# Patient Record
Sex: Female | Born: 1955 | Race: White | Marital: Married | Smoking: Never smoker
Health system: Northeastern US, Academic
[De-identification: ages and names within clinical notes are randomized; demographics above are authoritative.]

## PROBLEM LIST (undated history)

## (undated) DIAGNOSIS — E079 Disorder of thyroid, unspecified: Secondary | ICD-10-CM

## (undated) DIAGNOSIS — K219 Gastro-esophageal reflux disease without esophagitis: Secondary | ICD-10-CM

## (undated) DIAGNOSIS — I10 Essential (primary) hypertension: Secondary | ICD-10-CM

## (undated) DIAGNOSIS — J45909 Unspecified asthma, uncomplicated: Secondary | ICD-10-CM

## (undated) DIAGNOSIS — M1990 Unspecified osteoarthritis, unspecified site: Secondary | ICD-10-CM

## (undated) HISTORY — DX: Unspecified osteoarthritis, unspecified site: M19.90

## (undated) HISTORY — PX: DILATION AND CURETTAGE OF UTERUS: SHX78

## (undated) HISTORY — PX: COLONOSCOPY: SHX174

## (undated) HISTORY — DX: Unspecified asthma, uncomplicated: J45.909

## (undated) HISTORY — DX: Gastro-esophageal reflux disease without esophagitis: K21.9

## (undated) HISTORY — PX: TUBAL LIGATION: SHX77

## (undated) HISTORY — PX: APPENDECTOMY: SHX54

## (undated) HISTORY — DX: Essential (primary) hypertension: I10

## (undated) HISTORY — PX: CHOLECYSTECTOMY: SHX55

## (undated) HISTORY — DX: Disorder of thyroid, unspecified: E07.9

---

## 2013-01-13 ENCOUNTER — Encounter: Payer: Self-pay | Admitting: Gastroenterology

## 2013-01-13 LAB — HM COLONOSCOPY

## 2013-05-25 LAB — LDL CHOLESTEROL, DIRECT: LDL Calculated: 93

## 2014-01-13 LAB — HM MAMMOGRAPHY

## 2014-09-29 ENCOUNTER — Encounter: Payer: Self-pay | Admitting: Family Medicine

## 2014-12-04 ENCOUNTER — Other Ambulatory Visit: Payer: Self-pay | Admitting: Pediatrics

## 2014-12-04 ENCOUNTER — Ambulatory Visit: Payer: Self-pay | Admitting: Pediatrics

## 2014-12-04 VITALS — BP 134/76 | HR 80 | Temp 97.6°F | Ht 62.75 in | Wt 194.0 lb

## 2014-12-04 DIAGNOSIS — I10 Essential (primary) hypertension: Secondary | ICD-10-CM

## 2014-12-04 DIAGNOSIS — J309 Allergic rhinitis, unspecified: Secondary | ICD-10-CM

## 2014-12-04 DIAGNOSIS — E039 Hypothyroidism, unspecified: Secondary | ICD-10-CM

## 2014-12-04 DIAGNOSIS — J45909 Unspecified asthma, uncomplicated: Secondary | ICD-10-CM

## 2014-12-04 MED ORDER — TRIAMCINOLONE ACETONIDE 0.025 % EX LOTN *A*
TOPICAL_LOTION | Freq: Two times a day (BID) | CUTANEOUS | 1 refills | Status: DC | PRN
Start: 2014-12-04 — End: 2016-02-01

## 2014-12-04 MED ORDER — FLUTICASONE PROPIONATE 50 MCG/ACT NA SUSP *I*
2.0000 | Freq: Every day | NASAL | 1 refills | Status: DC
Start: 2014-12-04 — End: 2015-06-14

## 2014-12-04 MED ORDER — ALBUTEROL SULFATE HFA 108 (90 BASE) MCG/ACT IN AERS *I*
2.0000 | INHALATION_SPRAY | RESPIRATORY_TRACT | 1 refills | Status: DC | PRN
Start: 2014-12-04 — End: 2017-06-01

## 2014-12-04 NOTE — Progress Notes (Signed)
There is no problem list on file for this patient.        Subjective:     Patient ID: Patty ForestBridget Paschal is a 59 y.o. female.    HPI  Here for recheck HTN and Hypothyroidism.  States feels great.  No complaints has not had labs drawn in awhile.  Lives in [place] during the winter and does see provider there.  States has not had labs drawn in quite awhile.  Eating and sleeping ok  No chest pain .  No Sob  Has hx of asthma, but no problems for a long time.      No current outpatient prescriptions on file prior to visit.     No current facility-administered medications on file prior to visit.      Allergy History as of 12/04/14     CLARITHROMYCIN       Noted Status Severity Type Reaction    09/29/14 1342 Tilden FossaBrown, Leslie 05/01/08 Active Low Allergy Other (See Comments)    Comments:  Reaction not specified             ERYTHROMYCIN       Noted Status Severity Type Reaction    09/29/14 1343 Tilden FossaBrown, Leslie 05/01/08 Active Low Allergy Other (See Comments)    Comments:  Reaction not specified             VALDECOXIB       Noted Status Severity Type Reaction    12/04/14 1134 Lacie ScottsSmith, Debra S, LPN 54/02/8105/06/16 Active Low Allergy Other (See Comments)    Comments:  unknown           BACITRACIN-POLYMYXIN B       Noted Status Severity Type Reaction    12/04/14 1135 Lacie ScottsSmith, Debra S, LPN 19/14/7804/12/13 Active Low Allergy Other (See Comments)    Comments:  unknown               No past medical history on file.    {Common ambulatory SmartLinks:19316::"Patient's medications, allergies, past medical, surgical, social and family histories were reviewed and updated as appropriate."    Review of Systems   Constitutional: Negative.    HENT: Negative.    Respiratory: Negative.    Cardiovascular: Negative.    Genitourinary: Negative.    Neurological: Negative.    Psychiatric/Behavioral: Negative.              Objective:   Physical Exam   Constitutional: She appears well-developed and well-nourished. No distress.   HENT:   Right Ear: Tympanic membrane normal.    Left Ear: Tympanic membrane normal.   Nose: Nose normal.   Mouth/Throat: Oropharynx is clear and moist.   Eyes: Pupils are equal, round, and reactive to light.   Neck: Neck supple. No thyromegaly present.   Cardiovascular: Normal rate, regular rhythm and normal heart sounds.    Pulmonary/Chest: Effort normal and breath sounds normal.   Lymphadenopathy:     She has no cervical adenopathy.   Neurological: She has normal reflexes.   Psychiatric: She has a normal mood and affect. Her speech is normal and behavior is normal. Thought content normal.             Assessment:      HTN  Hypothyroidsim   Hx of Asthma , stable     Plan:      Cont meds  Will check labs  Diet and exercise reviewed  rtc 6 months or prn

## 2014-12-05 ENCOUNTER — Encounter: Payer: Self-pay | Admitting: Gastroenterology

## 2014-12-05 ENCOUNTER — Ambulatory Visit: Payer: Self-pay | Admitting: Pediatrics

## 2014-12-06 ENCOUNTER — Telehealth: Payer: Self-pay | Admitting: Family Medicine

## 2014-12-06 NOTE — Telephone Encounter (Signed)
Pt notified as per Sterling BigSharon Rashaunda Rahl, FNP 12/04/14 labs all ok.

## 2014-12-25 ENCOUNTER — Telehealth: Payer: Self-pay

## 2014-12-25 NOTE — Telephone Encounter (Signed)
Left message for pt to call the office for an fu appointment

## 2014-12-25 NOTE — Telephone Encounter (Signed)
Pt made appt 02/05/15

## 2014-12-29 ENCOUNTER — Other Ambulatory Visit: Payer: Self-pay | Admitting: Family Medicine

## 2014-12-29 DIAGNOSIS — E039 Hypothyroidism, unspecified: Secondary | ICD-10-CM

## 2014-12-31 ENCOUNTER — Other Ambulatory Visit: Payer: Self-pay | Admitting: Family Medicine

## 2015-01-05 ENCOUNTER — Other Ambulatory Visit: Payer: Self-pay | Admitting: Family Medicine

## 2015-01-16 ENCOUNTER — Other Ambulatory Visit: Payer: Self-pay | Admitting: Family Medicine

## 2015-01-16 DIAGNOSIS — I10 Essential (primary) hypertension: Secondary | ICD-10-CM

## 2015-01-19 ENCOUNTER — Encounter: Payer: Self-pay | Admitting: Gastroenterology

## 2015-01-19 LAB — HM MAMMOGRAPHY

## 2015-02-05 ENCOUNTER — Ambulatory Visit: Payer: Self-pay | Admitting: Family Medicine

## 2015-04-03 ENCOUNTER — Other Ambulatory Visit: Payer: Self-pay | Admitting: Family Medicine

## 2015-05-31 ENCOUNTER — Encounter: Payer: Self-pay | Admitting: Family Medicine

## 2015-06-04 ENCOUNTER — Ambulatory Visit
Admission: RE | Admit: 2015-06-04 | Discharge: 2015-06-04 | Disposition: A | Discharge status: Home / Self Care | Payer: Self-pay | Source: Ambulatory Visit | Admitting: Family Medicine

## 2015-06-04 ENCOUNTER — Encounter: Payer: Self-pay | Admitting: Family Medicine

## 2015-06-04 ENCOUNTER — Ambulatory Visit: Payer: Self-pay | Admitting: Family Medicine

## 2015-06-04 VITALS — BP 130/74 | HR 80 | Temp 97.8°F | Ht 62.75 in | Wt 198.0 lb

## 2015-06-04 DIAGNOSIS — Z0000 Encounter for general adult medical examination without abnormal findings: Secondary | ICD-10-CM

## 2015-06-04 DIAGNOSIS — I10 Essential (primary) hypertension: Secondary | ICD-10-CM

## 2015-06-04 DIAGNOSIS — R5383 Other fatigue: Secondary | ICD-10-CM

## 2015-06-04 DIAGNOSIS — Z124 Encounter for screening for malignant neoplasm of cervix: Secondary | ICD-10-CM

## 2015-06-04 DIAGNOSIS — E039 Hypothyroidism, unspecified: Secondary | ICD-10-CM

## 2015-06-04 DIAGNOSIS — Z01419 Encounter for gynecological examination (general) (routine) without abnormal findings: Secondary | ICD-10-CM

## 2015-06-04 LAB — POCT URINALYSIS DIPSTICK
Bilirubin,Ur: NEGATIVE
Blood,UA POCT: NEGATIVE
Glucose,UA POCT: NORMAL
Ketones,UA POCT: NEGATIVE
Leuk Esterase,UA POCT: NEGATIVE
Lot #: 20526703
Nitrite,UA POCT: NEGATIVE
PH,UA POCT: 7 (ref 5–8)
Protein,UA POCT: NEGATIVE mg/dL
Specific gravity,UA POCT: 1.01 (ref 1.002–1.03)
Urobilinogen,UA: NORMAL

## 2015-06-04 NOTE — Progress Notes (Signed)
Subjective:      Patty Robertson is a 59yo white female here for a routine exam and Follow-up fro HTN, Hypothyroid.     Had Arthroscopy done on L knee last year in [place]. +Osteoarthritis in both knees.    Has been feeling good. Will be leaving for [facility] in a few weeks.     Gynecologic History  Menopausal.  Last Pap: 12/20/2012.Marland Kitchen Results were: normal  Last mammogram: 12/2013. Results were: normal      Patient's medications, allergies, past medical, surgical, social and family histories were reviewed and updated.    Current Outpatient Prescriptions   Medication    potassium chloride SA (KLOR-CON M20) 20 mEq  tablet    CARTIA XT 240 MG 24 hr capsule    amLODIPine (NORVASC) 2.5 MG tablet    triamterene-hydrochlorothiazide (DYAZIDE) 37.5-25 MG per capsule    SYNTHROID 112 MCG tablet    Calcium Polycarbophil (KONSYL FIBER PO)    calcium citrate-vitamin D (CITRACAL+D) 315-250 MG-UNIT per tablet    Glucosamine-Chondroit-Vit C-Mn (GLUCOSAMINE CHONDR 1500 COMPLX PO)    albuterol HFA 108 (90 BASE) MCG/ACT inhaler    fluticasone (FLONASE ALLERGY RELIEF) 50 MCG/ACT nasal spray    triamcinolone (KENALOG) 0.025 % lotion     No current facility-administered medications for this visit.          Review of Systems    Constitutional: She has been feeling good overall.  EENT: No visual disturbance, no recent URI symptoms or sore throat.  Neck: No neck pain or masses.  Pulmonary: No cough or shortness of breath. Rare wheezing, uses Albuterol couple times/wk.  Cardiac: No chest pain or palpitations  Gastrointestinal: No nausea, vomiting, constipation or diarrhea.  Mild heartburn with certain foods.  Genitourinary: No dysuria or frequency. No vaginal discharge. Minimal stress incontinence.  Musculoskeletal: Knees hurt with walking downhill.   No weakness.  Integument: No rashes.  Neurologic: No seizures or numbness.      Objective:     Visit Vitals    BP 130/74 (BP Location: Left arm, Patient Position: Sitting, Cuff Size: large adult)     Pulse 80    Temp 36.6 C (97.8 F) (Temporal)    Ht 1.594 m (5' 2.75")    Wt 89.8 kg (198 lb)    BMI 35.35 kg/m2         General: Well-developed well-nourished white female in no acute distress.  HEENT: Normocephalic; conjunctivae pink pupils reactive fundi normal; Nose mild congestion with clear discharge.TMs are normal; pharynx no erythema.  Neck: supple, no adenopathy, thyroid is symmetrically enlarged without nodules.  Lungs: Clear with decreased breath sounds in both bases.  Heart: Regular rate with soft systolic murmur.  No S3.  Carotids 2+ without bruits.  No JVD.  Breasts: Soft without masses.  Abdomen: Soft, nontender, no hepatosplenomegaly or other masses.  Active bowel sounds.  Back: Normal curvature, nontender.  Straight leg raising negative.  Extremities: Full range of motion, osteoarthritic changes of both knees.  Trace peripheral edema.  Pedal pulses intact.  Feet look good.  Neurologic: Deep tendon reflexes are +2 and symmetrical throughout.  Cranial nerves II through XII are grossly intact.  Strength intact throughout.  Normal sensation in hands and feet.  Pelvic: External shows no lesions.  There is a white scant discharge.  Cervix is normal, nontender.  Uterus normal size, nontender.  Adnexa norma  Rectal: No masses, normal tone. Small external hemorrhoid.      Assessment:      Healthy  female exam.   Hypertension  Hypothyroidism  Osteoarthritis knees  Overweight  Asthma/mild persistent  Allergic rhinitis     Plan:     She is to continue all her present medications.  Advised her to increase the Symbicort to 2 puffs twice a day if needed.  Encouraged her to start increasing exercise to help with her knees.  Did discuss that she may eventually need knee replacements.    She is to get labs done prior to leaving for [place].  We'll call her with results.    ThinPrep was done.  She has had mammogram done in July.    Follow-up next spring when she returns from [place].

## 2015-06-05 ENCOUNTER — Telehealth: Payer: Self-pay | Admitting: Family Medicine

## 2015-06-05 ENCOUNTER — Other Ambulatory Visit: Payer: Self-pay | Admitting: Family Medicine

## 2015-06-05 LAB — COMPREHENSIVE METABOLIC PANEL
ALT: 26 U/L (ref 12–78)
AST: 21 U/L (ref 15–37)
Albumin: 3.8 g/dL (ref 3.4–5.0)
Alk Phos: 71 U/L (ref 46–116)
Anion Gap: 8.8 mmol/L (ref 6–15)
Bilirubin,Total: 0.4 mg/dL (ref 0.2–1.0)
CO2: 30.2 mmol/L (ref 21–32)
Calcium: 9.2 mg/dL (ref 8.5–10.1)
Chloride: 101 mmol/L (ref 98–107)
Creatinine: 0.91 mg/dL (ref 0.55–1.02)
GFR,Black: 76.56 *
GFR,Caucasian: 63.27 *
Glucose: 84 mg/dL (ref 74–106)
Lab: 8 mg/dL (ref 7–18)
Potassium: 3.6 mmol/L (ref 3.5–5.1)
Sodium: 140 mmol/L (ref 136–145)
Total Protein: 7.6 g/dL (ref 6.4–8.2)

## 2015-06-05 LAB — CBC AND DIFFERENTIAL
Baso # K/uL: 0.04 10*3/uL (ref 0.00–0.07)
Basophil %: 0.5 % (ref 0–1)
Eos # K/uL: 0.29 10*3/uL (ref 0.05–0.5)
Eosinophil %: 3.6 % (ref 0.7–10.0)
Hematocrit: 43.5 % (ref 36.0–44.5)
Hemoglobin: 14.7 g/dL (ref 12.5–15.3)
Lymph # K/uL: 3.34 10*3/uL (ref 1.1–4.0)
Lymphocyte %: 41.9 % — ABNORMAL HIGH (ref 24.0–40)
MCH: 31.2 pg (ref 28.6–32.6)
MCHC: 33.8 g/dl (ref 32.0–36.0)
MCV: 92.4 fl (ref 80–100)
Mean Platelet Volume: 9.3 fl (ref 8.0–12.0)
Mono # K/uL: 0.63 10*3/uL (ref 0.2–1.0)
Monocyte %: 7.9 % (ref 4.0–15.0)
Neut # K/uL: 3.68 10*3/uL (ref 2.0–7.1)
Platelets: 322 10*3/uL (ref 140–400)
RBC Distribution Width-SD: 39.8 fL (ref 25.0–75.0)
RBC: 4.71 10*6/uL (ref 3.9–5.2)
RDW: 12 % (ref 11.0–15.0)
Seg Neut %: 46.1 % — ABNORMAL LOW (ref 48.0–70.0)
WBC: 7.98 10*3/uL (ref 4.0–12.0)

## 2015-06-05 LAB — LIPID PANEL
Chol/HDL Ratio: 4
Cholesterol: 209 MG/dL — ABNORMAL HIGH (ref 109–200)
HDL: 51 mg/dL (ref 40–60)
LDL Calculated: 140.2 mg/dL
Triglycerides: 89 mg/dL (ref 30–199)

## 2015-06-05 LAB — TSH: TSH: 3.207 u[IU]/mL (ref 0.358–3.74)

## 2015-06-05 NOTE — Telephone Encounter (Signed)
Information given to patient as per Dr. Peterson.

## 2015-06-05 NOTE — Telephone Encounter (Signed)
Labs are all ok; chol is better

## 2015-06-12 ENCOUNTER — Encounter: Payer: Self-pay | Admitting: Family Medicine

## 2015-06-12 LAB — GYN CYTOLOGY

## 2015-06-14 ENCOUNTER — Other Ambulatory Visit: Payer: Self-pay | Admitting: Pediatrics

## 2015-06-14 DIAGNOSIS — J309 Allergic rhinitis, unspecified: Secondary | ICD-10-CM

## 2015-06-25 ENCOUNTER — Other Ambulatory Visit: Payer: Self-pay | Admitting: Family Medicine

## 2015-06-25 DIAGNOSIS — E039 Hypothyroidism, unspecified: Secondary | ICD-10-CM

## 2015-07-02 ENCOUNTER — Other Ambulatory Visit: Payer: Self-pay | Admitting: Pediatrics

## 2015-07-07 ENCOUNTER — Other Ambulatory Visit: Payer: Self-pay | Admitting: Family Medicine

## 2015-07-24 ENCOUNTER — Other Ambulatory Visit: Payer: Self-pay | Admitting: Pediatrics

## 2015-07-24 ENCOUNTER — Telehealth: Payer: Self-pay | Admitting: Family Medicine

## 2015-07-24 DIAGNOSIS — I10 Essential (primary) hypertension: Secondary | ICD-10-CM

## 2015-07-24 NOTE — Telephone Encounter (Signed)
[facility] called and is [facility] but having issue with them as far as the script being sent in a timely manner.  Is there any issues on our end with them?

## 2015-07-24 NOTE — Telephone Encounter (Signed)
Requested script sent as per patient.

## 2015-09-28 ENCOUNTER — Other Ambulatory Visit: Payer: Self-pay | Admitting: Family Medicine

## 2015-12-11 ENCOUNTER — Other Ambulatory Visit: Payer: Self-pay | Admitting: Family Medicine

## 2015-12-11 DIAGNOSIS — J309 Allergic rhinitis, unspecified: Secondary | ICD-10-CM

## 2015-12-23 ENCOUNTER — Other Ambulatory Visit: Payer: Self-pay | Admitting: Family Medicine

## 2015-12-23 DIAGNOSIS — E039 Hypothyroidism, unspecified: Secondary | ICD-10-CM

## 2015-12-24 ENCOUNTER — Ambulatory Visit: Payer: Self-pay | Admitting: Pediatrics

## 2015-12-31 ENCOUNTER — Other Ambulatory Visit: Payer: Self-pay | Admitting: Family Medicine

## 2015-12-31 NOTE — Telephone Encounter (Signed)
Patient called back and is scheduled for 02/01/16 with Jasmine DecemberSharon

## 2015-12-31 NOTE — Telephone Encounter (Signed)
Please set her up for an appointment. Then send back so the script can be sent. Thank you.

## 2015-12-31 NOTE — Telephone Encounter (Signed)
Called and left Pt a message to call office back

## 2016-01-03 ENCOUNTER — Other Ambulatory Visit: Payer: Self-pay | Admitting: Family Medicine

## 2016-01-03 DIAGNOSIS — E039 Hypothyroidism, unspecified: Secondary | ICD-10-CM

## 2016-01-03 MED ORDER — SYNTHROID 112 MCG PO TABS
112.0000 ug | ORAL_TABLET | Freq: Every day | ORAL | 1 refills | Status: DC
Start: 2016-01-03 — End: 2016-07-01

## 2016-01-03 NOTE — Telephone Encounter (Signed)
Script sent to Dr. Peterson for approval. Patient notified.

## 2016-01-03 NOTE — Telephone Encounter (Signed)
Clarisse GougeBridget states that she had called [facility] regarding a refill for Synthroid. She was told by [facility] that her doctors office voided the refill. She states that she is not in any danger of running out of the medication in the immediate future but wants to get it straightened out before she does get low on it. She states she is currently in [place] because her husband recently had a knee replacement. She will be coming in for an appt on 02/01/16 with Jasmine DecemberSharon. Please advise.

## 2016-01-14 ENCOUNTER — Other Ambulatory Visit: Payer: Self-pay | Admitting: Family Medicine

## 2016-01-21 ENCOUNTER — Other Ambulatory Visit: Payer: Self-pay | Admitting: Gastroenterology

## 2016-01-21 LAB — HM MAMMOGRAPHY

## 2016-01-29 ENCOUNTER — Encounter: Payer: Self-pay | Admitting: Family Medicine

## 2016-02-01 ENCOUNTER — Other Ambulatory Visit: Payer: Self-pay

## 2016-02-01 ENCOUNTER — Ambulatory Visit: Payer: Self-pay | Admitting: Pediatrics

## 2016-02-01 ENCOUNTER — Encounter: Payer: Self-pay | Admitting: Pediatrics

## 2016-02-01 VITALS — BP 124/82 | HR 58 | Temp 97.8°F | Wt 199.0 lb

## 2016-02-01 DIAGNOSIS — J45909 Unspecified asthma, uncomplicated: Secondary | ICD-10-CM

## 2016-02-01 DIAGNOSIS — E039 Hypothyroidism, unspecified: Secondary | ICD-10-CM

## 2016-02-01 DIAGNOSIS — I10 Essential (primary) hypertension: Secondary | ICD-10-CM

## 2016-02-01 LAB — PCMH DEPRESSION ASSESSMENT

## 2016-02-01 MED ORDER — DILTIAZEM HCL COATED BEADS 240 MG PO CP24 *I*
240.0000 mg | ORAL_CAPSULE | Freq: Every day | ORAL | 1 refills | Status: DC
Start: 2016-02-01 — End: 2016-07-17

## 2016-02-01 MED ORDER — TRIAMCINOLONE ACETONIDE 0.025 % EX LOTN *A*
TOPICAL_LOTION | Freq: Two times a day (BID) | CUTANEOUS | 1 refills | Status: DC | PRN
Start: 2016-02-01 — End: 2020-02-02

## 2016-02-01 NOTE — Progress Notes (Signed)
Subjective:     Patient ID: Patty Robertson is a 60 y.o. female.    HPI  Here for recheck HTN, Hypothyroid and Asthma  HTN-  No headache,.  No chest pain no sob.  Hypothyroid- no cold intolerance, no weight gain or loss.  Sleeping fine  Asthma- recent problems with ASthma  Splits her time between here and [place]. Just returned to [place] recently.  Does have provider now in [place]- has copies of her recent labs with him which were all WNL.  Also her last visit note with him.      Current Outpatient Prescriptions on File Prior to Visit   Medication Sig Dispense Refill    amLODIPine (NORVASC) 2.5 MG tablet TAKE 1 TABLET DAILY 90 tablet 1    SYNTHROID 112 MCG tablet Take 1 tablet (112 mcg total) by mouth daily 90 tablet 1    triamterene-hydrochlorothiazide (DYAZIDE) 37.5-25 MG per capsule TAKE 1 CAPSULE DAILY 90 capsule 0    fluticasone (FLONASE) 50 MCG/ACT nasal spray USE 2 SPRAYS NASALLY DAILY 48 g 1    potassium chloride SA (KLOR-CON M20) 20 mEq  tablet TAKE 1 TABLET TWICE A DAY 180 tablet 1    CARTIA XT 240 MG 24 hr capsule TAKE 1 CAPSULE DAILY 90 capsule 1    calcium citrate-vitamin D (CITRACAL+D) 315-250 MG-UNIT per tablet Take 2 tablets by mouth daily      albuterol HFA 108 (90 BASE) MCG/ACT inhaler Inhale 2 puffs into the lungs every 4 hours as needed for Wheezing   Shake well before each use. 3 Inhaler 1    triamcinolone (KENALOG) 0.025 % lotion Apply topically 2 times daily as needed for Rash 60 mL 1    Calcium Polycarbophil (KONSYL FIBER PO) Take 6 g by mouth daily      Glucosamine-Chondroit-Vit C-Mn (GLUCOSAMINE CHONDR 1500 COMPLX PO) Take 1 tablet by mouth daily       No current facility-administered medications on file prior to visit.      Allergy History as of 02/01/16     CLARITHROMYCIN       Noted Status Severity Type Reaction    09/29/14 1342 Tilden Fossa 05/01/08 Active Low Allergy Other (See Comments)    Comments:  Reaction not specified             ERYTHROMYCIN       Noted Status Severity  Type Reaction    09/29/14 1343 Tilden Fossa 05/01/08 Active Low Allergy Other (See Comments)    Comments:  Reaction not specified             VALDECOXIB       Noted Status Severity Type Reaction    12/04/14 1134 Lacie Scotts, LPN 28/41/32 Active Low Allergy Other (See Comments)    Comments:  unknown           BACITRACIN-POLYMYXIN B       Noted Status Severity Type Reaction    12/04/14 1135 Lacie Scotts, LPN 44/01/02 Active Low Allergy Other (See Comments)    Comments:  unknown               Past Medical History:   Diagnosis Date    Arthritis     Asthma     GERD (gastroesophageal reflux disease)     Hypertension     Thyroid disease        :"Patient's medications, allergies, past medical, surgical, social and family histories were reviewed and updated as appropriate."}  Review of Systems   Constitutional: Negative.    HENT: Negative.    Respiratory: Negative.    Cardiovascular: Negative.    Genitourinary: Negative.    Neurological: Negative.            Objective:   Physical Exam   Constitutional: She appears well-developed and well-nourished. No distress.   HENT:   Nose: Nose normal.   Mouth/Throat: Oropharynx is clear and moist.   Eyes: Pupils are equal, round, and reactive to light.   Cardiovascular: Normal rate, regular rhythm and normal heart sounds.    Pulmonary/Chest: Effort normal and breath sounds normal.   Musculoskeletal: She exhibits no edema.   Lymphadenopathy:     She has no cervical adenopathy.   Neurological: She is alert. She has normal reflexes.   Psychiatric: She has a normal mood and affect. Her behavior is normal. Judgment and thought content normal.           Assessment:      HTN  Hypothyroid  Asthma       Plan:      1- [facility] Hypertension Plan    Based on this patients clinical history and according to JNC guidelines target BP goal is: less than 130/80  Based on the patient's last BP of BP: 124/82 the patient is: at goal  The plan to reach goal   1.  Reviewed pt's understanding of  medications including any barriers to adherence.   2.  Recommended lifestyle modifications: weight reduction, exercise plan, DASH eating plan, dietary sodium reduction and medication compliance   3.  The patient understands their clinical goals and will undertake self-management recommendations including:weight reduction  exercise plan  DASH eating plan  dietary sodium reduction  medication compliance     4.  Medication Management: no changes made    5.  Referral to Care Management:: No   6.  Patient Ed/Self-management tools provided: Current self-management tools adequate      2- labs reviewed from [place], TSH normal  Cont current synthroid dose    3-  Inhalers as needed  Will recheck beginning of Dec for her PE with MD before returning to [place]

## 2016-02-01 NOTE — Addendum Note (Signed)
Addended by: Sterling Big on: 02/01/2016 03:20 PM     Modules accepted: Orders

## 2016-03-26 ENCOUNTER — Other Ambulatory Visit: Payer: Self-pay | Admitting: Family Medicine

## 2016-03-30 ENCOUNTER — Other Ambulatory Visit: Payer: Self-pay | Admitting: Family Medicine

## 2016-06-02 ENCOUNTER — Telehealth: Payer: Self-pay | Admitting: Family Medicine

## 2016-06-02 DIAGNOSIS — I10 Essential (primary) hypertension: Secondary | ICD-10-CM

## 2016-06-02 DIAGNOSIS — E039 Hypothyroidism, unspecified: Secondary | ICD-10-CM

## 2016-06-02 NOTE — Telephone Encounter (Signed)
Any labs prior to Thursday appointment?

## 2016-06-02 NOTE — Telephone Encounter (Signed)
Pt notified as per Sharon Avanna Sowder, FNP. Lab req faxed to [facility] per pt request.

## 2016-06-02 NOTE — Telephone Encounter (Signed)
Patty Robertson is calling in regards to she has an appointment on 06/05/16 and she would like to know if she needs labs done please give her a call either way.

## 2016-06-02 NOTE — Telephone Encounter (Signed)
[facility]  Lipids   TSH

## 2016-06-03 ENCOUNTER — Other Ambulatory Visit: Payer: Self-pay | Admitting: Family Medicine

## 2016-06-03 LAB — COMPREHENSIVE METABOLIC PANEL
ALT: 25 U/L (ref 12–78)
AST: 17 U/L (ref 15–37)
Albumin: 3.6 g/dL (ref 3.4–5.0)
Alk Phos: 60 U/L (ref 46–116)
Anion Gap: 6.8 mmol/L (ref 6–15)
Bilirubin,Total: 0.3 mg/dL (ref 0.2–1.0)
CO2: 31.2 mmol/L (ref 21–32)
Calcium: 9.3 mg/dL (ref 8.5–10.1)
Chloride: 104 mmol/L (ref 98–107)
Creatinine: 0.89 mg/dL (ref 0.55–1.02)
GFR,Black: 78.28 (ref >60)
GFR,Caucasian: 64.7 (ref >60)
Glucose: 108 mg/dL — ABNORMAL HIGH (ref 74–106)
Lab: 13 mg/dL (ref 7–18)
Potassium: 3.9 mmol/L (ref 3.5–5.1)
Sodium: 142 mmol/L (ref 136–145)
Total Protein: 7.3 g/dL (ref 6.4–8.2)

## 2016-06-03 LAB — LIPID PANEL
Chol/HDL Ratio: 3.7
Cholesterol: 193 MG/dL (ref 109–200)
HDL: 51 mg/dL (ref 40–60)
LDL Calculated: 122.2 mg/dL
Triglycerides: 99 mg/dL (ref 30–199)

## 2016-06-03 LAB — TSH: TSH: 3.196 u[IU]/mL (ref 0.358–3.74)

## 2016-06-04 ENCOUNTER — Encounter: Payer: Self-pay | Admitting: Family Medicine

## 2016-06-05 ENCOUNTER — Ambulatory Visit: Payer: Self-pay | Admitting: Pediatrics

## 2016-06-05 ENCOUNTER — Other Ambulatory Visit: Payer: Self-pay | Admitting: Pediatrics

## 2016-06-05 ENCOUNTER — Encounter: Payer: Self-pay | Admitting: Pediatrics

## 2016-06-05 VITALS — BP 130/82 | HR 78 | Temp 98.1°F | Ht 62.5 in | Wt 200.1 lb

## 2016-06-05 DIAGNOSIS — R8290 Unspecified abnormal findings in urine: Secondary | ICD-10-CM

## 2016-06-05 DIAGNOSIS — H6691 Otitis media, unspecified, right ear: Secondary | ICD-10-CM

## 2016-06-05 DIAGNOSIS — J45909 Unspecified asthma, uncomplicated: Secondary | ICD-10-CM

## 2016-06-05 DIAGNOSIS — J069 Acute upper respiratory infection, unspecified: Secondary | ICD-10-CM

## 2016-06-05 DIAGNOSIS — Z0000 Encounter for general adult medical examination without abnormal findings: Secondary | ICD-10-CM

## 2016-06-05 DIAGNOSIS — E039 Hypothyroidism, unspecified: Secondary | ICD-10-CM

## 2016-06-05 DIAGNOSIS — I10 Essential (primary) hypertension: Secondary | ICD-10-CM

## 2016-06-05 LAB — POCT URINALYSIS DIPSTICK
Bilirubin,Ur: NEGATIVE
Blood,UA POCT: NEGATIVE
Glucose,UA POCT: NORMAL
Ketones,UA POCT: NEGATIVE
Leuk Esterase,UA POCT: 2 — AB
Lot #: 21650803
Nitrite,UA POCT: NEGATIVE
PH,UA POCT: 8 (ref 5–8)
Protein,UA POCT: NEGATIVE mg/dL
Specific gravity,UA POCT: 1.005 (ref 1.002–1.03)
Urobilinogen,UA: NORMAL

## 2016-06-05 LAB — PCMH DEPRESSION ASSESSMENT

## 2016-06-05 MED ORDER — AMOXICILLIN 500 MG PO TABS *I*
500.0000 mg | ORAL_TABLET | Freq: Two times a day (BID) | ORAL | 0 refills | Status: DC
Start: 2016-06-05 — End: 2016-11-25

## 2016-06-05 NOTE — H&P (Signed)
History and Physical    HISTORY:  Chief Complaint   Patient presents with    Annual Exam     PE & Reck HTN, Asthma, Hypothyroid         History of Present Illness:    HPI  Here for annual PE.  States feels good. Hx of HTN, Asthma and Hypothyroid  HTN- no chest pain. No sob.   No ankle swelling.  Denies excessive sodium intake.   Non smoker  Asthma - no issues with breathing. Did get flu shot.   States has had some mild nasal congestion recently.  And ears feel full.  Using mucinex  Hypothyroid-  Tolerating synthroid.  In weight changes.   [facility]   Spends 6 months in [place] for the winter, leaving next week will be back in May  Problems:  There is no problem list on file for this patient.       Past Medical/Surgical History:   Past Medical History:   Diagnosis Date    Arthritis     Asthma     GERD (gastroesophageal reflux disease)     Hypertension     Thyroid disease      Past Surgical History:   Procedure Laterality Date    APPENDECTOMY      CHOLECYSTECTOMY      COLONOSCOPY      DILATION AND CURETTAGE OF UTERUS      TUBAL LIGATION           Allergies:    Allergies   Allergen Reactions    Bextra [Valdecoxib] Other (See Comments)     unknown    Biaxin [Clarithromycin] Other (See Comments)     Reaction not specified      Erythromycin Ethylsuccinate [Erythromycin] Other (See Comments)     Reaction not specified      Polysporin [Bacitracin-Polymyxin B] Other (See Comments)     unknown       Current medications:    Current Outpatient Prescriptions   Medication Sig    triamterene-hydrochlorothiazide (DYAZIDE) 37.5-25 MG per capsule TAKE 1 CAPSULE DAILY    potassium chloride SA (KLOR-CON M20) 20 mEq  tablet TAKE 1 TABLET TWICE A DAY    diltiazem (CARTIA XT) 240 MG 24 hr capsule Take 1 capsule (240 mg total) by mouth daily    Swallow whole. Do not crush or chew.    triamcinolone (KENALOG) 0.025 % lotion Apply topically 2 times daily as needed for Rash    amLODIPine (NORVASC) 2.5 MG  tablet TAKE 1 TABLET DAILY    SYNTHROID 112 MCG tablet Take 1 tablet (112 mcg total) by mouth daily    fluticasone (FLONASE) 50 MCG/ACT nasal spray USE 2 SPRAYS NASALLY DAILY    calcium citrate-vitamin D (CITRACAL+D) 315-250 MG-UNIT per tablet Take 2 tablets by mouth daily    albuterol HFA 108 (90 BASE) MCG/ACT inhaler Inhale 2 puffs into the lungs every 4 hours as needed for Wheezing   Shake well before each use.    Calcium Polycarbophil (KONSYL FIBER PO) Take 6 g by mouth daily    Glucosamine-Chondroit-Vit C-Mn (GLUCOSAMINE CHONDR 1500 COMPLX PO) Take 1 tablet by mouth daily       Family History:    Family History   Problem Relation Age of Onset    Hypertension Father     Colon cancer Father     Cancer Father     Heart failure Father     Diabetes Father  Heart Disease Father     High Blood Pressure Father     Colon cancer Paternal Grandfather     Heart failure Paternal Grandfather     Heart Disease Paternal Grandfather     Arthritis Mother     Cancer Mother     GERD Mother     Arthritis Paternal Grandmother     Cancer Paternal Grandmother     Heart failure Paternal Grandmother     Heart Disease Paternal Grandmother     High Blood Pressure Paternal Grandmother        Social/Occupational History:   Social History     Social History    Marital status: Married     Spouse name: N/A    Number of children: N/A    Years of education: N/A     Social History Main Topics    Smoking status: Never Smoker    Smokeless tobacco: Never Used    Alcohol use 0.6 oz/week     1 Glasses of wine per week      Comment: rare    Drug use: No    Sexual activity: Yes     Partners: Male     Pharmacist, hospitalBirth control/ protection: None     Other Topics Concern    None     Social History Narrative         Review of Systems:    Review of Systems   Constitutional: Negative.    HENT: Positive for congestion.    Eyes: Negative.    Respiratory: Negative.    Cardiovascular: Negative.    Gastrointestinal: Negative.     Genitourinary: Negative.    Skin: Negative.    Neurological: Negative.    Psychiatric/Behavioral: Negative.        Vital Signs:   BP 130/82 (BP Location: Right arm, Patient Position: Sitting, Cuff Size: large adult)   Pulse 78   Temp 36.7 C (98.1 F) (Temporal)    Ht 1.588 m (5' 2.5")   Wt 90.8 kg (200 lb 2 oz)   SpO2 98%   BMI 36.02 kg/m2      PHYSICAL EXAM:  Physical Exam   Constitutional: She is oriented to person, place, and time. She appears well-developed and well-nourished. No distress.   HENT:   Head: Normocephalic.   Right Ear: Ear canal normal. Tympanic membrane is erythematous.   Left Ear: Tympanic membrane and ear canal normal.   Nose: Rhinorrhea (mild) present.   Mouth/Throat: Oropharynx is clear and moist.   Eyes: EOM are normal. Pupils are equal, round, and reactive to light.   Neck: Normal range of motion. Neck supple. No JVD present. No thyromegaly present.   Cardiovascular: Normal rate, regular rhythm and normal heart sounds.    Pulmonary/Chest: Effort normal and breath sounds normal.   Abdominal: Soft. Bowel sounds are normal. She exhibits no distension. There is no hepatosplenomegaly. There is no tenderness. There is no CVA tenderness.   Genitourinary: No breast swelling, tenderness or discharge.   Musculoskeletal: Normal range of motion. She exhibits no edema.   Lymphadenopathy:     She has no cervical adenopathy.   Neurological: She is alert and oriented to person, place, and time. She has normal reflexes. No cranial nerve deficit.   Psychiatric: She has a normal mood and affect. Her behavior is normal. Judgment and thought content normal.           Assessment:    There are no diagnoses linked to this encounter.   .Marland Kitchen  Annual PE  HTN  Asthma  Hypothyroid  Right OM  URI       Plan:       1-  Diet and exercise reviewed    2-[facility] Hypertension Plan    Based on this patients clinical history and according to JNC guidelines target BP goal is: less than 130/80  Based on the patient's last BP of BP:  130/82 the patient is: at goal  The plan to reach goal   1.  Reviewed pt's understanding of medications including any barriers to adherence.   2.  Recommended lifestyle modifications: weight reduction, exercise plan, DASH eating plan, dietary sodium reduction and medication compliance   3.  The patient understands their clinical goals and will undertake self-management recommendations including:weight reduction  exercise plan  DASH eating plan  dietary sodium reduction  medication compliance     4.  Medication Management: no changes made    5.  Referral to Care Management:: No   6.  Patient Ed/Self-management tools provided: Current self-management tools adequate      3-cont inhalers  4-  Labs reviewed    Cont synthroid  5,6-  Tylenol as needed     Amoxil as ordered       Cont with mucinex  Will recheck with MD in May when returns from [place]

## 2016-06-07 LAB — AEROBIC CULTURE

## 2016-06-08 ENCOUNTER — Other Ambulatory Visit: Payer: Self-pay | Admitting: Family Medicine

## 2016-06-08 DIAGNOSIS — J309 Allergic rhinitis, unspecified: Secondary | ICD-10-CM

## 2016-07-01 ENCOUNTER — Other Ambulatory Visit: Payer: Self-pay | Admitting: Family Medicine

## 2016-07-01 DIAGNOSIS — E039 Hypothyroidism, unspecified: Secondary | ICD-10-CM

## 2016-07-03 ENCOUNTER — Other Ambulatory Visit: Payer: Self-pay | Admitting: Family Medicine

## 2016-07-03 DIAGNOSIS — J309 Allergic rhinitis, unspecified: Secondary | ICD-10-CM

## 2016-07-03 NOTE — Telephone Encounter (Signed)
Clarisse GougeBridget called and would like to know if her flonase could be sent to [facility], she states that she never got it in December because she asked our office to hold off because of her travels between [place]. She would now like it sent to [facility].

## 2016-07-04 MED ORDER — FLUTICASONE PROPIONATE 50 MCG/ACT NA SUSP *I*
2.0000 | Freq: Every day | NASAL | 1 refills | Status: DC
Start: 2016-07-04 — End: 2016-12-31

## 2016-07-17 ENCOUNTER — Other Ambulatory Visit: Payer: Self-pay | Admitting: Family Medicine

## 2016-07-17 DIAGNOSIS — I10 Essential (primary) hypertension: Secondary | ICD-10-CM

## 2016-07-17 MED ORDER — AMLODIPINE BESYLATE 2.5 MG PO TABS *I*
2.5000 mg | ORAL_TABLET | Freq: Every day | ORAL | 1 refills | Status: DC
Start: 2016-07-17 — End: 2017-01-13

## 2016-07-17 MED ORDER — DILTIAZEM HCL COATED BEADS 240 MG PO CP24 *I*
240.0000 mg | ORAL_CAPSULE | Freq: Every day | ORAL | 1 refills | Status: DC
Start: 2016-07-17 — End: 2017-01-13

## 2016-07-17 NOTE — Telephone Encounter (Signed)
90 day refills for [facility]

## 2016-09-25 ENCOUNTER — Other Ambulatory Visit: Payer: Self-pay | Admitting: Pediatrics

## 2016-11-25 ENCOUNTER — Ambulatory Visit: Payer: PRIVATE HEALTH INSURANCE | Attending: Family Medicine | Admitting: Family Medicine

## 2016-11-25 ENCOUNTER — Encounter: Payer: Self-pay | Admitting: Family Medicine

## 2016-11-25 VITALS — BP 148/80 | HR 80 | Temp 98.9°F | Wt 198.1 lb

## 2016-11-25 DIAGNOSIS — J45909 Unspecified asthma, uncomplicated: Secondary | ICD-10-CM

## 2016-11-25 DIAGNOSIS — E039 Hypothyroidism, unspecified: Secondary | ICD-10-CM

## 2016-11-25 DIAGNOSIS — Z0000 Encounter for general adult medical examination without abnormal findings: Secondary | ICD-10-CM

## 2016-11-25 DIAGNOSIS — I10 Essential (primary) hypertension: Secondary | ICD-10-CM

## 2016-11-25 LAB — POCT URINALYSIS DIPSTICK
Bilirubin,Ur: NEGATIVE
Blood,UA POCT: NEGATIVE
Glucose,UA POCT: NORMAL mg/dL
Ketones,UA POCT: NEGATIVE mg/dL
Leuk Esterase,UA POCT: NEGATIVE
Lot #: 21650803
Nitrite,UA POCT: NEGATIVE
PH,UA POCT: 8 (ref 5–8)
Protein,UA POCT: NEGATIVE mg/dL
Specific gravity,UA POCT: 1.005 (ref 1.002–1.030)
Urobilinogen,UA: NORMAL mg/dL

## 2016-11-25 LAB — PCMH DEPRESSION ASSESSMENT

## 2016-11-25 NOTE — Progress Notes (Signed)
Subjective:      Patty Robertson is a 61yo white female here for a routine exam and Follow-up fro HTN, Hypothyroid.      +Osteoarthritis in both knees. Did get the Hyalgan shots in both knees this winter. This did help some.    Has been feeling good. Spends the winters in [place]. Is physically active there.    Would like to switch to generic Synthroid if this will not cause problems.     Had one episode of wheezing earlier this month. Used the Albuterol twice and has had no problems since then.     Gynecologic History  Menopausal.  Last Pap: 05/2015.Marland Kitchen Results were: normal  Last mammogram: 12/2015. Results were: normal      Patient's medications, allergies, past medical, surgical, social and family histories were reviewed and updated.    Current Outpatient Prescriptions   Medication    triamterene-hydrochlorothiazide (DYAZIDE) 37.5-25 MG per capsule    amLODIPine (NORVASC) 2.5 MG tablet    diltiazem (CARTIA XT) 240 MG 24 hr capsule    SYNTHROID 112 MCG tablet    potassium chloride SA (KLOR-CON M20) 20 mEq  tablet    Calcium Polycarbophil (KONSYL FIBER PO)    calcium citrate-vitamin D (CITRACAL+D) 315-250 MG-UNIT per tablet    fluticasone (FLONASE) 50 MCG/ACT nasal spray    triamcinolone (KENALOG) 0.025 % lotion    albuterol HFA 108 (90 BASE) MCG/ACT inhaler     No current facility-administered medications for this visit.          Review of Systems    Constitutional: She has been feeling good overall.  EENT: No visual disturbance, no recent URI symptoms or sore throat.  Neck: No neck pain or masses.  Pulmonary: No cough or shortness of breath. Rare wheezing.  Cardiac: No chest pain or palpitations  Gastrointestinal: No nausea, vomiting, constipation or diarrhea.  Mild heartburn with certain foods.  Genitourinary: No dysuria or frequency. No vaginal discharge. Minimal stress incontinence, wears a pad.  Musculoskeletal: Knees hurt with walking downhill.   No weakness.  Integument: No rashes.  Neurologic: No seizures or  numbness.      Objective:     BP 148/80 (BP Location: Right arm, Patient Position: Sitting, Cuff Size: adult)   Pulse 80   Temp 37.2 C (98.9 F) (Temporal)    Wt 89.9 kg (198 lb 2 oz)   SpO2 97%   BMI 35.66 kg/m2      General: Well-developed well-nourished white female in no acute distress.  HEENT: Normocephalic; conjunctivae pink, pupils reactive, fundi normal; Nose mild congestion with clear discharge.TMs are normal; pharynx no erythema.  Neck: supple, no adenopathy, thyroid is symmetrically enlarged without nodules.  Lungs: Clear with decreased breath sounds in both bases.  Heart: Regular rate with soft systolic murmur.  No S3.  Carotids 2+ without bruits.  No JVD.  Breasts: Soft without masses.  Abdomen: Soft, nontender, no hepatosplenomegaly or other masses.  Active bowel sounds.  Back: Normal curvature, nontender.  Straight leg raising negative.  Extremities: Full range of motion, osteoarthritic changes of both knees.  Trace peripheral edema.  Pedal pulses intact.  Feet look good.  Neurologic: Deep tendon reflexes are +2 and symmetrical throughout.  Cranial nerves II through XII are grossly intact.  Strength intact throughout.  Normal sensation in hands and feet.      Assessment:      Healthy female exam.   Hypertension  Hypothyroidism  Osteoarthritis knees  Overweight  Asthma/mild persistent  Allergic  rhinitis     Plan:     She is to continue all her present medications.  Can continue the Albuterol prn.  Encouraged her to start increasing exercise to help with her knees.  Also encouraged to work on weight loss. Did discuss that she may eventually need knee replacements.  Reviewed healthy diet.    She is to get fasting labs done.     She will be due for mammogram in July.  Colonoscopy is UTD. Five year recheck next year.    She refuses immunizations.    Follow[place]

## 2016-11-26 ENCOUNTER — Telehealth: Payer: Self-pay | Admitting: Family Medicine

## 2016-11-26 ENCOUNTER — Other Ambulatory Visit: Payer: Self-pay | Admitting: Family Medicine

## 2016-11-26 LAB — COMPREHENSIVE METABOLIC PANEL
ALT: 24 U/L (ref 12–78)
AST: 23 U/L (ref 15–37)
Albumin: 3.6 g/dL (ref 3.4–5.0)
Alk Phos: 70 U/L (ref 46–116)
Anion Gap: 8.3 mmol/L (ref 6–15)
Bilirubin,Total: 0.3 mg/dL (ref 0.2–1.0)
CO2: 28.7 mmol/L (ref 21–32)
Calcium: 9 mg/dL (ref 8.5–10.1)
Chloride: 104 mmol/L (ref 98–107)
Creatinine: 0.79 mg/dL (ref 0.55–1.02)
GFR,Black: 89.52 (ref >60)
GFR,Caucasian: 73.99 (ref >60)
Glucose: 92 mg/dL (ref 74–106)
Lab: 11 mg/dL (ref 7–18)
Potassium: 3.8 mmol/L (ref 3.5–5.1)
Sodium: 141 mmol/L (ref 136–145)
Total Protein: 7.5 g/dL (ref 6.4–8.2)

## 2016-11-26 LAB — LIPID PANEL
Chol/HDL Ratio: 3.9
Cholesterol: 191 MG/dL (ref 109–200)
HDL: 48 mg/dL (ref 40–60)
LDL Calculated: 125.6 mg/dL
Triglycerides: 87 mg/dL (ref 30–199)

## 2016-11-26 LAB — TSH: TSH: 1.287 u[IU]/mL (ref 0.358–3.74)

## 2016-11-26 NOTE — Telephone Encounter (Signed)
Her labs are all good.

## 2016-11-26 NOTE — Telephone Encounter (Signed)
Patient notified per Dr. Peterson via MyChart message.

## 2016-12-23 ENCOUNTER — Other Ambulatory Visit: Payer: Self-pay | Admitting: Pediatrics

## 2016-12-28 ENCOUNTER — Other Ambulatory Visit: Payer: Self-pay | Admitting: Pediatrics

## 2016-12-28 DIAGNOSIS — E039 Hypothyroidism, unspecified: Secondary | ICD-10-CM

## 2016-12-31 ENCOUNTER — Other Ambulatory Visit: Payer: Self-pay | Admitting: Family Medicine

## 2016-12-31 DIAGNOSIS — J309 Allergic rhinitis, unspecified: Secondary | ICD-10-CM

## 2017-01-13 ENCOUNTER — Other Ambulatory Visit: Payer: Self-pay | Admitting: Family Medicine

## 2017-01-13 DIAGNOSIS — I10 Essential (primary) hypertension: Secondary | ICD-10-CM

## 2017-01-20 ENCOUNTER — Other Ambulatory Visit: Payer: Self-pay | Admitting: Gastroenterology

## 2017-01-20 LAB — HM MAMMOGRAPHY

## 2017-03-24 ENCOUNTER — Other Ambulatory Visit: Payer: Self-pay | Admitting: Pediatrics

## 2017-05-28 ENCOUNTER — Encounter: Payer: Self-pay | Admitting: Family Medicine

## 2017-06-01 ENCOUNTER — Ambulatory Visit: Payer: PRIVATE HEALTH INSURANCE | Attending: Pediatrics | Admitting: Pediatrics

## 2017-06-01 ENCOUNTER — Other Ambulatory Visit: Payer: Self-pay | Admitting: Pediatrics

## 2017-06-01 ENCOUNTER — Encounter: Payer: Self-pay | Admitting: Pediatrics

## 2017-06-01 VITALS — BP 130/80 | HR 72 | Temp 97.1°F | Ht 62.5 in | Wt 200.0 lb

## 2017-06-01 DIAGNOSIS — E039 Hypothyroidism, unspecified: Secondary | ICD-10-CM

## 2017-06-01 DIAGNOSIS — I10 Essential (primary) hypertension: Secondary | ICD-10-CM

## 2017-06-01 LAB — COMPREHENSIVE METABOLIC PANEL
ALT: 23 U/L (ref 12–78)
AST: 17 U/L (ref 15–37)
Albumin: 3.7 g/dL (ref 3.4–5.0)
Alk Phos: 64 U/L (ref 46–116)
Anion Gap: 8 mmol/L (ref 6–15)
Bilirubin,Total: 0.4 mg/dL (ref 0.1–1.2)
CO2: 29 mmol/L (ref 21–32)
Calcium: 9.2 mg/dL (ref 8.5–10.1)
Chloride: 104 mmol/L (ref 98–107)
Creatinine: 0.86 mg/dL (ref 0.55–1.02)
GFR,Black: 81.17 (ref >60)
GFR,Caucasian: 67.08 (ref >60)
Glucose: 99 mg/dL (ref 74–106)
Lab: 12 mg/dL (ref 7–18)
Potassium: 3.5 mmol/L (ref 3.5–5.1)
Sodium: 141 mmol/L (ref 136–145)
Total Protein: 7.4 g/dL (ref 6.4–8.2)

## 2017-06-01 LAB — TSH: TSH: 2.037 (ref 0.358–3.74)

## 2017-06-01 LAB — T4, FREE: Free T4: 1.13 ng/dL (ref 0.76–1.46)

## 2017-06-01 MED ORDER — ALBUTEROL SULFATE HFA 108 (90 BASE) MCG/ACT IN AERS *I*
2.0000 | INHALATION_SPRAY | RESPIRATORY_TRACT | 1 refills | Status: DC | PRN
Start: 2017-06-01 — End: 2018-03-03

## 2017-06-02 NOTE — Progress Notes (Signed)
Subjective:     Patient ID: Patty Robertson is a 61 y.o. female.    HPI   Here for recheck HTN and hypothyroid.    HTN- no headache. No dizziness. No sob.  Non smoker.   Tries not to use a lot of salt.  Doesn't exercise like she should states has OA of knees , sees ortho in [place] .    Hypothyroid- no increased fatigue or weight changes    Does spend most of fall and winter  in [place]. Does have a provider there.   Is here due to husband is hunting for this week, will be going back to [place] in a few days.  Current Outpatient Prescriptions on File Prior to Visit   Medication Sig Dispense Refill    triamterene-hydrochlorothiazide (DYAZIDE) 37.5-25 MG per capsule TAKE 1 CAPSULE DAILY 90 capsule 0    CARTIA XT 240 MG 24 hr capsule TAKE 1 CAPSULE DAILY (SWALLOW WHOLE, DO NOT CRUSH OR CHEW) 90 capsule 1    amLODIPine (NORVASC) 2.5 MG tablet TAKE 1 TABLET DAILY 90 tablet 1    fluticasone (FLONASE) 50 MCG/ACT nasal spray USE 2 SPRAYS NASALLY DAILY 48 g 1    SYNTHROID 112 MCG tablet TAKE 1 TABLET DAILY 90 tablet 1    potassium chloride SA (KLOR-CON M20) 20 mEq  tablet TAKE 1 TABLET TWICE A DAY 180 tablet 1    Calcium Polycarbophil (KONSYL FIBER PO) Take 6 g by mouth daily      calcium citrate-vitamin D (CITRACAL+D) 315-250 MG-UNIT per tablet Take 2 tablets by mouth daily      triamcinolone (KENALOG) 0.025 % lotion Apply topically 2 times daily as needed for Rash 60 mL 1     No current facility-administered medications on file prior to visit.      Allergy History as of 06/02/17     CLARITHROMYCIN       Noted Status Severity Type Reaction    09/29/14 1342 Tilden FossaBrown, Leslie 05/01/08 Active Low Allergy Other (See Comments)    Comments:  Reaction not specified             ERYTHROMYCIN       Noted Status Severity Type Reaction    09/29/14 1343 Tilden FossaBrown, Leslie 05/01/08 Active Low Allergy Other (See Comments)    Comments:  Reaction not specified             VALDECOXIB       Noted Status Severity Type Reaction    12/04/14  1134 Lacie ScottsSmith, Debra S, LPN 16/03/9605/06/16 Active Low Allergy Other (See Comments)    Comments:  unknown           BACITRACIN-POLYMYXIN B       Noted Status Severity Type Reaction    12/04/14 1135 Lacie ScottsSmith, Debra S, LPN 04/54/904/12/13 Active Low Allergy Other (See Comments)    Comments:  unknown               Past Medical History:   Diagnosis Date    Arthritis     Asthma     GERD (gastroesophageal reflux disease)     Hypertension     Thyroid disease      "Patient's medications, allergies, past medical, surgical, social and family histories were reviewed and updated as appropriate.    Review of Systems   Constitutional: Positive for unexpected weight change (up 2).   HENT: Negative.    Respiratory: Negative.    Cardiovascular: Negative.    Gastrointestinal: Negative.  Musculoskeletal:        Knees ache     Neurological: Negative.    Psychiatric/Behavioral: Negative.            Objective:   Physical Exam   Constitutional: She appears well-developed and well-nourished. No distress.   HENT:   Nose: Nose normal.   Mouth/Throat: Oropharynx is clear and moist.   Eyes: Pupils are equal, round, and reactive to light.   Neck: No JVD present. No thyromegaly present.   Cardiovascular: Normal rate, regular rhythm and normal heart sounds.    Pulmonary/Chest: Effort normal and breath sounds normal.   Musculoskeletal: Edema: trace.   Lymphadenopathy:     She has no cervical adenopathy.   Neurological: She has normal reflexes.   Psychiatric: She has a normal mood and affect.           Assessment:      Hypothyroid  HTN  OA knees       Plan:      1-  Will call when get lab results  Cont current dose of synthroid  2 [facility] Hypertension Plan    Based on this patients clinical history and according to JNC guidelines target BP goal is: less than 130/80  Based on the patient's last BP of BP: 130/80 the patient is: at goal  The plan to reach goal   1.  Reviewed pt's understanding of medications including any barriers to adherence.   2.  Recommended  lifestyle modifications: weight reduction, exercise plan, DASH eating plan, dietary sodium reduction and medication compliance   3.  The patient understands their clinical goals and will undertake self-management recommendations including:weight reduction  exercise plan  DASH eating plan  dietary sodium reduction  medication compliance     4.  Medication Management: no changes made    5.  Referral to Care Management:: No   6.  Patient Ed/Self-management tools provided: No    3- to cont with ortho she sees in [place]    rtc 6 months  With MD or prn

## 2017-06-21 ENCOUNTER — Other Ambulatory Visit: Payer: Self-pay | Admitting: Pediatrics

## 2017-06-23 ENCOUNTER — Other Ambulatory Visit: Payer: Self-pay | Admitting: Family Medicine

## 2017-06-23 NOTE — Telephone Encounter (Signed)
Reorder

## 2017-06-24 ENCOUNTER — Other Ambulatory Visit: Payer: Self-pay

## 2017-06-24 MED ORDER — TRIAMTERENE-HCTZ 37.5-25 MG PO CAPS *A*
1.0000 | ORAL_CAPSULE | Freq: Every day | ORAL | 0 refills | Status: DC
Start: 2017-06-24 — End: 2017-07-06

## 2017-06-27 ENCOUNTER — Other Ambulatory Visit: Payer: Self-pay | Admitting: Family Medicine

## 2017-06-27 DIAGNOSIS — E039 Hypothyroidism, unspecified: Secondary | ICD-10-CM

## 2017-06-27 NOTE — Telephone Encounter (Signed)
reorder

## 2017-06-29 ENCOUNTER — Telehealth: Payer: Self-pay | Admitting: Family Medicine

## 2017-06-29 MED ORDER — SYNTHROID 112 MCG PO TABS
112.0000 ug | ORAL_TABLET | Freq: Every day | ORAL | 1 refills | Status: DC
Start: 2017-06-29 — End: 2017-07-06

## 2017-06-29 NOTE — Telephone Encounter (Signed)
Patty Robertson is calling in regards to her scripts were [facility] [place] and she is in [place] now so she would like to have these cancelled out for her insurance is changing as well.  She thinks she has enough to last her if not she will call to let us know.  Please advise

## 2017-06-30 ENCOUNTER — Other Ambulatory Visit: Payer: Self-pay | Admitting: Family Medicine

## 2017-06-30 DIAGNOSIS — J309 Allergic rhinitis, unspecified: Secondary | ICD-10-CM

## 2017-07-02 NOTE — Telephone Encounter (Signed)
Patty Robertson called again regarding this. She states that all of her medications will now go through [facility]. They are also going to be faxing over a notification of this change. Patty Robertson states this will be for all of her medications. [name] also said that a script was [facility] to [facility] [place]

## 2017-07-03 ENCOUNTER — Other Ambulatory Visit: Payer: Self-pay

## 2017-07-03 DIAGNOSIS — I10 Essential (primary) hypertension: Secondary | ICD-10-CM

## 2017-07-03 MED ORDER — DILTIAZEM HCL COATED BEADS 240 MG PO CP24 *I*
240.0000 mg | ORAL_CAPSULE | Freq: Every day | ORAL | 1 refills | Status: DC
Start: 2017-07-03 — End: 2017-07-06

## 2017-07-06 ENCOUNTER — Other Ambulatory Visit: Payer: Self-pay | Admitting: Family Medicine

## 2017-07-06 DIAGNOSIS — J309 Allergic rhinitis, unspecified: Secondary | ICD-10-CM

## 2017-07-06 DIAGNOSIS — I10 Essential (primary) hypertension: Secondary | ICD-10-CM

## 2017-07-06 DIAGNOSIS — E039 Hypothyroidism, unspecified: Secondary | ICD-10-CM

## 2017-07-06 MED ORDER — SYNTHROID 112 MCG PO TABS
112.0000 ug | ORAL_TABLET | Freq: Every day | ORAL | 1 refills | Status: DC
Start: 2017-07-06 — End: 2017-12-23

## 2017-07-06 MED ORDER — TRIAMTERENE-HCTZ 37.5-25 MG PO CAPS *A*
1.0000 | ORAL_CAPSULE | Freq: Every day | ORAL | 0 refills | Status: DC
Start: 2017-07-06 — End: 2017-09-24

## 2017-07-06 MED ORDER — DILTIAZEM HCL COATED BEADS 240 MG PO CP24 *I*
240.0000 mg | ORAL_CAPSULE | Freq: Every day | ORAL | 1 refills | Status: DC
Start: 2017-07-06 — End: 2018-01-05

## 2017-07-06 MED ORDER — AMLODIPINE BESYLATE 2.5 MG PO TABS *I*
2.5000 mg | ORAL_TABLET | Freq: Every day | ORAL | 1 refills | Status: DC
Start: 2017-07-06 — End: 2017-12-23

## 2017-07-06 MED ORDER — FLUTICASONE PROPIONATE 50 MCG/ACT NA SUSP *I*
2.0000 | Freq: Every day | NASAL | 1 refills | Status: DC
Start: 2017-07-06 — End: 2017-12-23

## 2017-07-06 NOTE — Telephone Encounter (Signed)
Patient's pharmacy is updated she uses [facility]. She is upset because her prescription refills keep getting sent to [facility] [place] and she resides in [place]. The only time she is in [place] is during the Summer. She would like 90 day supply. Please advise.

## 2017-07-08 ENCOUNTER — Telehealth: Payer: Self-pay | Admitting: Family Medicine

## 2017-07-08 NOTE — Telephone Encounter (Signed)
[facility] CALLING   About a drug interaction with    triamterene-hydrochlorothiazide (DYAZIDE) 37.5-25 MG per capsule 1 capsule, Oral, DAILY         Also, a duplicate in therapy with the Amlodipine and Diltiazem.     They want to speak to a nurse.  Ref # [id number]

## 2017-07-08 NOTE — Telephone Encounter (Signed)
She is to be on all 3 meds. Cardizem and Amlodipine are different class of medications, is not duplicate therapy. No concern with any interactions.

## 2017-07-08 NOTE — Telephone Encounter (Signed)
Please clarify

## 2017-07-09 ENCOUNTER — Telehealth: Payer: Self-pay | Admitting: Family Medicine

## 2017-07-09 NOTE — Telephone Encounter (Signed)
Called office, closed at this time.

## 2017-07-09 NOTE — Telephone Encounter (Signed)
[facility] called about a drug interaction with the triamerterne-hydrochlorothiazide.  Reference #[id number] if you need to call for anything.

## 2017-07-09 NOTE — Telephone Encounter (Signed)
Addressed yesterday.

## 2017-07-12 ENCOUNTER — Other Ambulatory Visit: Payer: Self-pay | Admitting: Pediatrics

## 2017-07-12 DIAGNOSIS — I10 Essential (primary) hypertension: Secondary | ICD-10-CM

## 2017-07-14 ENCOUNTER — Telehealth: Payer: Self-pay | Admitting: Family Medicine

## 2017-07-14 NOTE — Telephone Encounter (Signed)
Patient questioned whether or not her new mail order [facility] would be sending her more cartia, because she had just gotten one from her old mail order and then [facility] sent another. She got a message they would be sending more. Let her know she would need to contact [facility] to discuss this matter.

## 2017-07-14 NOTE — Telephone Encounter (Signed)
Patient called inquiring questions regarding her meds. Please call [phone].

## 2017-09-24 ENCOUNTER — Other Ambulatory Visit: Payer: Self-pay | Admitting: Family Medicine

## 2017-11-30 ENCOUNTER — Ambulatory Visit: Payer: PRIVATE HEALTH INSURANCE | Admitting: Family Medicine

## 2017-11-30 ENCOUNTER — Other Ambulatory Visit: Payer: Self-pay

## 2017-11-30 ENCOUNTER — Encounter: Payer: Self-pay | Admitting: Family Medicine

## 2017-11-30 ENCOUNTER — Other Ambulatory Visit: Payer: Self-pay | Admitting: Family Medicine

## 2017-11-30 VITALS — BP 150/98 | HR 60 | Temp 98.1°F | Wt 195.0 lb

## 2017-11-30 DIAGNOSIS — I10 Essential (primary) hypertension: Secondary | ICD-10-CM

## 2017-11-30 DIAGNOSIS — J4520 Mild intermittent asthma, uncomplicated: Secondary | ICD-10-CM

## 2017-11-30 DIAGNOSIS — E039 Hypothyroidism, unspecified: Secondary | ICD-10-CM

## 2017-11-30 DIAGNOSIS — R5383 Other fatigue: Secondary | ICD-10-CM

## 2017-11-30 LAB — CBC AND DIFFERENTIAL
Baso # K/uL: 0.05 10*3/uL (ref 0.00–0.07)
Basophil %: 0.6 % (ref 0–1)
Eos # K/uL: 0.11 10*3/uL (ref 0.05–0.5)
Eosinophil %: 1.4 % (ref 0.7–10.0)
Hematocrit: 45.4 % — ABNORMAL HIGH (ref 36.0–44.5)
Hemoglobin: 15.5 g/dL — ABNORMAL HIGH (ref 12.5–15.3)
Lymph # K/uL: 2.8 10*3/uL (ref 1.1–4.0)
Lymphocyte %: 35.5 % (ref 24.0–40)
MCH: 31.7 pg (ref 28.6–32.6)
MCHC: 34.1 g/dl (ref 32.0–36.0)
MCV: 92.8 fl (ref 80–100)
Mean Platelet Volume: 9.4 fl (ref 8.0–12.0)
Mono # K/uL: 0.56 10*3/uL (ref 0.2–1.0)
Monocyte %: 7.1 % (ref 4.0–15.0)
Neut # K/uL: 4.36 10*3/uL (ref 2.0–7.1)
Platelets: 347 10*3/uL (ref 140–400)
RBC Distribution Width-SD: 41.5 fL (ref 25.0–75.0)
RBC: 4.89 10*6/uL (ref 3.9–5.2)
RDW: 12.4 % (ref 11.0–15.0)
Seg Neut %: 55.4 % (ref 48.0–70.0)
WBC: 7.88 10*3/uL (ref 4.0–12.0)

## 2017-11-30 LAB — LIPID PANEL
Chol/HDL Ratio: 3.9
Cholesterol: 188 MG/dL (ref 109–200)
HDL: 48 mg/dL (ref 40–60)
LDL Calculated: 118.6 mg/dL
Triglycerides: 107 mg/dL (ref 30–199)

## 2017-11-30 LAB — COMPREHENSIVE METABOLIC PANEL
ALT: 20 U/L (ref 12–78)
AST: 15 U/L (ref 15–37)
Albumin: 3.6 g/dL (ref 3.4–5.0)
Alk Phos: 79 U/L (ref 46–116)
Anion Gap: 10.4 mmol/L (ref 6–15)
Bilirubin,Total: 0.4 mg/dL (ref 0.1–1.2)
CO2: 28.6 mmol/L (ref 21–32)
Calcium: 9.4 mg/dL (ref 8.5–10.1)
Chloride: 104 mmol/L (ref 98–107)
Creatinine: 0.79 mg/dL (ref 0.55–1.02)
GFR,Black: 89.23 (ref >60)
GFR,Caucasian: 73.74 (ref >60)
Glucose: 105 mg/dL (ref 74–106)
Lab: 10 mg/dL (ref 7–18)
Potassium: 3.6 mmol/L (ref 3.5–5.1)
Sodium: 143 mmol/L (ref 136–145)
Total Protein: 7.5 g/dL (ref 6.4–8.2)

## 2017-11-30 LAB — TSH: TSH: 0.901 (ref 0.358–3.74)

## 2017-11-30 LAB — MAGNESIUM: Magnesium: 2.2 mg/dL (ref 1.8–2.4)
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
## 2017-12-23 ENCOUNTER — Other Ambulatory Visit: Payer: Self-pay | Admitting: Family Medicine

## 2017-12-23 DIAGNOSIS — J309 Allergic rhinitis, unspecified: Secondary | ICD-10-CM

## 2017-12-23 DIAGNOSIS — E039 Hypothyroidism, unspecified: Secondary | ICD-10-CM

## 2018-01-05 ENCOUNTER — Other Ambulatory Visit: Payer: Self-pay | Admitting: Family Medicine

## 2018-01-05 ENCOUNTER — Other Ambulatory Visit: Payer: Self-pay

## 2018-01-05 DIAGNOSIS — I10 Essential (primary) hypertension: Secondary | ICD-10-CM

## 2018-01-05 MED ORDER — POTASSIUM CHLORIDE CRYS CR 20 MEQ PO TBCR *I*
20.0000 meq | ORAL_TABLET | Freq: Two times a day (BID) | ORAL | 1 refills | Status: DC
Start: 2018-01-05 — End: 2018-07-12

## 2018-01-05 MED ORDER — DILTIAZEM HCL COATED BEADS 240 MG PO CP24 *I*
240.0000 mg | ORAL_CAPSULE | Freq: Every day | ORAL | 1 refills | Status: DC
Start: 2018-01-05 — End: 2018-06-21

## 2018-01-05 NOTE — Telephone Encounter (Signed)
90 day supply with refills

## 2018-01-21 ENCOUNTER — Other Ambulatory Visit: Payer: Self-pay | Admitting: Gastroenterology

## 2018-01-21 LAB — HM MAMMOGRAPHY

## 2018-03-02 ENCOUNTER — Encounter: Payer: Self-pay | Admitting: Family Medicine

## 2018-03-03 ENCOUNTER — Other Ambulatory Visit
Admission: RE | Admit: 2018-03-03 | Discharge: 2018-03-03 | Disposition: A | Discharge status: Home / Self Care | Payer: BLUE CROSS/BLUE SHIELD | Source: Ambulatory Visit | Attending: Pediatrics | Admitting: Pediatrics

## 2018-03-03 ENCOUNTER — Encounter: Payer: Self-pay | Admitting: Pediatrics

## 2018-03-03 ENCOUNTER — Ambulatory Visit: Payer: BLUE CROSS/BLUE SHIELD | Admitting: Pediatrics

## 2018-03-03 ENCOUNTER — Other Ambulatory Visit: Payer: Self-pay

## 2018-03-03 VITALS — BP 140/90 | HR 66 | Temp 97.2°F | Wt 200.0 lb

## 2018-03-03 DIAGNOSIS — E039 Hypothyroidism, unspecified: Secondary | ICD-10-CM

## 2018-03-03 DIAGNOSIS — I10 Essential (primary) hypertension: Secondary | ICD-10-CM

## 2018-03-03 DIAGNOSIS — J45909 Unspecified asthma, uncomplicated: Secondary | ICD-10-CM

## 2018-03-03 DIAGNOSIS — Z800 Family history of malignant neoplasm of digestive organs: Secondary | ICD-10-CM

## 2018-03-03 DIAGNOSIS — Z0000 Encounter for general adult medical examination without abnormal findings: Secondary | ICD-10-CM

## 2018-03-03 LAB — POCT CLINITEK URINALYSIS
Bilirubin, Clinitek POCT UA: NEGATIVE
Blood, Clinitek POCT UA: NEGATIVE
Exp date: 4302020
Glucose, Clinitek POCT UA: NEGATIVE mg/dL
Ketone, Clinitek POCT UA: NEGATIVE mg/dL
Lot #: 810024
Nitrite, Clinitek POCT UA: NEGATIVE
Protein, Clinitek POCT UA: NEGATIVE mg/dL
Specific Gravity, Clinitek POCT UA: 1.01 (ref 1.001–1.035)
Urobilinogen, Clinitek POCT UA: 0.2 E.u./dL (ref 0.2–1.0)
pH, Clinitek POCT US: 7 (ref 4.6–8.0)

## 2018-03-03 MED ORDER — ALBUTEROL SULFATE HFA 108 (90 BASE) MCG/ACT IN AERS *I*
2.0000 | INHALATION_SPRAY | RESPIRATORY_TRACT | 0 refills | Status: DC | PRN
Start: 2018-03-03 — End: 2020-02-02

## 2018-03-03 NOTE — H&P (Signed)
History and Physical    HISTORY:  Chief Complaint   Patient presents with    Annual Exam     evaluation and pap         History of Present Illness:    HPI  Here for annual pe and pap.   No hx abnormal pap  Hx of HTN, Asthma and Hypothyroid  Eating and sleeping ok  States had normal mammo in July at [facility]  States needs referral for colonoscopy.   Not having any bowel issues.   + family hx of colon cancer. Saw Dr Cherlyn Roberts for last colonoscopy. Is asking to see DR Mertzel  HTN- no headache. No dizziness.  No sob. Non smoker.  BP has been ok when she checks it.  Asthma- no increase sob.    Hypothyroid-  No weight changes , no increased fatigue  No sweating  Pt splits her time between here and [place].  Is heading back end of month, but will be back during hunting season  Problems:  There is no problem list on file for this patient.       Past Medical/Surgical History:   Past Medical History:   Diagnosis Date    Arthritis     Asthma     GERD (gastroesophageal reflux disease)     Hypertension     Thyroid disease      Past Surgical History:   Procedure Laterality Date    APPENDECTOMY      CHOLECYSTECTOMY      COLONOSCOPY      DILATION AND CURETTAGE OF UTERUS      TUBAL LIGATION           Allergies:    Allergies   Allergen Reactions    Bextra [Valdecoxib] Other (See Comments)     unknown    Biaxin [Clarithromycin] Other (See Comments)     Reaction not specified      Erythromycin Ethylsuccinate [Erythromycin] Other (See Comments)     Reaction not specified      Polysporin [Bacitracin-Polymyxin B] Other (See Comments)     unknown       Current medications:    Current Outpatient Prescriptions   Medication Sig    diltiazem (CARTIA XT) 240 MG 24 hr capsule Take 1 capsule (240 mg total) by mouth daily    Swallow whole. Do not crush or chew.    potassium chloride SA (KLOR-CON M20) 20 mEq  tablet Take 1 tablet (20 mEq total) by mouth 2 times daily    SYNTHROID 112 MCG tablet TAKE 1 TABLET DAILY    fluticasone  (FLONASE) 50 MCG/ACT nasal spray USE 2 SPRAYS NASALLY DAILY    amLODIPine (NORVASC) 2.5 MG tablet TAKE 1 TABLET DAILY    Psyllium (METAMUCIL PO) Take by mouth    triamterene-hydrochlorothiazide (DYAZIDE) 37.5-25 MG per capsule TAKE 1 CAPSULE DAILY    Magnesium 250 MG TABS Take 1 tablet by mouth daily    calcium citrate-vitamin D (CITRACAL+D) 315-250 MG-UNIT per tablet Take 2 tablets by mouth daily    albuterol HFA 108 (90 Base) MCG/ACT inhaler Inhale 2 puffs into the lungs every 4 hours as needed for Wheezing   Shake well before each use.    triamcinolone (KENALOG) 0.025 % lotion Apply topically 2 times daily as needed for Rash       Family History:    Family History   Problem Relation Age of Onset    Hypertension Father     Colon cancer Father  Cancer Father     Heart failure Father     Diabetes Father     Heart Disease Father     High Blood Pressure Father     Colon cancer Paternal Grandfather     Heart failure Paternal Grandfather     Heart Disease Paternal Grandfather     Arthritis Mother     Cancer Mother     GERD Mother     Arthritis Paternal Grandmother     Cancer Paternal Grandmother     Heart failure Paternal Grandmother     Heart Disease Paternal Grandmother     High Blood Pressure Paternal Grandmother        Social/Occupational History:   Social History     Social History    Marital status: Married     Spouse name: Lollie Sails    Number of children: 1    Years of education: 14     Occupational History    Retired/Teachers Aide      Social History Main Topics    Smoking status: Never Smoker    Smokeless tobacco: Never Used    Alcohol use 0.6 oz/week     1 Glasses of wine per week      Comment: rare    Drug use: No      Comment: No familiy hx of drug or alcohol abuse    Sexual activity: Yes     Partners: Male     Birth control/ protection: None     Other Topics Concern    None     Social History Narrative    None         Review of Systems:    Review of Systems    Constitutional: Negative for weight loss (up 5).   HENT: Negative.    Eyes: Negative.    Respiratory: Negative.    Cardiovascular: Negative.    Gastrointestinal: Negative.    Genitourinary: Negative.    Musculoskeletal: Negative.    Skin: Negative.    Neurological: Negative.    Psychiatric/Behavioral: Negative.        Vital Signs:   BP (!) 170/110 (BP Location: Left arm, Patient Position: Sitting, Cuff Size: adult)    Pulse 66    Temp 36.2 C (97.2 F) (Tympanic)    Wt 90.7 kg (200 lb)    SpO2 98%    BMI 36.00 kg/m       PHYSICAL EXAM:  Physical Exam   Constitutional: She is oriented to person, place, and time. She appears well-developed and well-nourished. No distress.   HENT:   Head: Normocephalic.   Right Ear: Tympanic membrane and ear canal normal.   Left Ear: Tympanic membrane and ear canal normal.   Nose: Nose normal.   Mouth/Throat: Oropharynx is clear and moist.   Eyes: Pupils are equal, round, and reactive to light. Conjunctivae and EOM are normal.   Neck: Neck supple. No JVD present. No thyromegaly present.   Cardiovascular: Normal rate, regular rhythm, normal heart sounds and normal pulses.    Pulmonary/Chest: Effort normal and breath sounds normal.   Abdominal: Soft. Normal appearance and bowel sounds are normal. There is no hepatosplenomegaly. There is no tenderness. There is no CVA tenderness. Hernia confirmed negative in the right inguinal area and confirmed negative in the left inguinal area.   Genitourinary: Vagina normal and uterus normal. No breast swelling, tenderness or discharge. There is no lesion on the right labia. There is no lesion on the left labia. No  vaginal discharge found.   Musculoskeletal: Normal range of motion. She exhibits no edema.   Lymphadenopathy:     She has no cervical adenopathy.     She has no axillary adenopathy.        Right: No inguinal adenopathy present.        Left: No inguinal adenopathy present.   Neurological: She is alert and oriented to person, place, and  time. She has normal strength and normal reflexes. No cranial nerve deficit or sensory deficit. Coordination and gait normal.   Skin: Skin is warm and dry.   Psychiatric: She has a normal mood and affect. Her speech is normal and behavior is normal. Judgment and thought content normal.           Assessment:    Delesa was seen today for annual exam.    Diagnoses and all orders for this visit:    Physical exam  -     POCT Clinitek Urinalysis    Other orders  -     albuterol HFA 108 (90 Base) MCG/ACT inhaler; Inhale 2 puffs into the lungs every 4 hours as needed for Wheezing   Shake well before each use.    HTN  Hypothyroid   Asthma   . Family History of colon cancer     Plan:    thin prep done   diet and exercise reviewed  Will get set up for colonoscopy    Urine dip neg   cont current meds  Labs from June reviewed    PCMH Hypertension Plan    Based on this patients clinical history and according to JNC guidelines target BP goal is: less than 140/90 (or 150/90 due to age [facility]))  Based on the patient's last BP of BP: 140/90 the patient is: at goal  The plan to reach goal   1.  Reviewed pt's understanding of medications including any barriers to adherence.   2.  Recommended lifestyle modifications: weight reduction, exercise plan, DASH eating plan, dietary sodium reduction and medication compliance   3.  The patient understands their clinical goals and will undertake self-management recommendations including:weight reduction  exercise plan  DASH eating plan  dietary sodium reduction  medication compliance     4.  Medication Management: no changes made    5.  Referral to Care Management:: No   6.  Patient Ed/Self-management tools provided: No    Will recheck bp here in 2 weeks for nurse visit    Cont synthroid  Will get a flu shot next month    Will set up with DR mertzel     rtc 4 months  To get labs done when back at hunting season

## 2018-03-03 NOTE — Progress Notes (Signed)
H

## 2018-03-04 ENCOUNTER — Other Ambulatory Visit: Payer: Self-pay | Admitting: Pediatrics

## 2018-03-15 ENCOUNTER — Encounter: Payer: Self-pay | Admitting: Gastroenterology

## 2018-03-15 LAB — GYN CYTOLOGY

## 2018-03-16 LAB — GYN CYTOLOGY

## 2018-03-17 ENCOUNTER — Ambulatory Visit: Payer: BLUE CROSS/BLUE SHIELD

## 2018-03-17 ENCOUNTER — Telehealth: Payer: Self-pay

## 2018-03-17 NOTE — Telephone Encounter (Signed)
Patient presented to office for blood pressure check 160/90 left arm. Patient's blood pressure machine 178/100.  Patient has recorded blood pressures taken at home. (See attached).

## 2018-03-17 NOTE — Telephone Encounter (Signed)
Patient informed of stated.

## 2018-03-17 NOTE — Telephone Encounter (Signed)
Advise her to keep checking BP at home since readings are actually better than what we get in office.

## 2018-03-17 NOTE — Progress Notes (Signed)
Patient presented to office for blood pressure check. B/P160/98  Left arm. Patient's blood pressure machine 178/100. Dr. Vonita MossPeterson aware of stated.

## 2018-03-23 ENCOUNTER — Other Ambulatory Visit: Payer: Self-pay | Admitting: Pediatrics

## 2018-05-03 ENCOUNTER — Other Ambulatory Visit
Admission: RE | Admit: 2018-05-03 | Discharge: 2018-05-03 | Disposition: A | Discharge status: Home / Self Care | Payer: BLUE CROSS/BLUE SHIELD | Source: Ambulatory Visit | Attending: Pediatrics | Admitting: Pediatrics

## 2018-05-03 ENCOUNTER — Ambulatory Visit: Payer: BLUE CROSS/BLUE SHIELD | Admitting: Pediatrics

## 2018-05-03 ENCOUNTER — Encounter: Payer: Self-pay | Admitting: Pediatrics

## 2018-05-03 VITALS — BP 150/90 | HR 68 | Temp 96.9°F | Wt 195.0 lb

## 2018-05-03 DIAGNOSIS — Z124 Encounter for screening for malignant neoplasm of cervix: Secondary | ICD-10-CM

## 2018-05-03 NOTE — Progress Notes (Signed)
Subjective:     Patient ID: Patty Robertson is a 62 y.o. female.    HPI   Here for repeat pap. Had pap in Sept report states limited cells.  Not having any issues at all.  Is here for a few weeks for hunting season then going back to [place] for the winter.  Current Outpatient Medications on File Prior to Visit   Medication Sig Dispense Refill    famotidine (PEPCID) 20 MG tablet Take 20 mg by mouth 2 times daily      diltiazem (CARTIA XT) 240 MG 24 hr capsule Take 1 capsule (240 mg total) by mouth daily    Swallow whole. Do not crush or chew. 90 capsule 1    potassium chloride SA (KLOR-CON M20) 20 mEq  tablet Take 1 tablet (20 mEq total) by mouth 2 times daily 180 tablet 1    SYNTHROID 112 MCG tablet TAKE 1 TABLET DAILY 90 tablet 1    fluticasone (FLONASE) 50 MCG/ACT nasal spray USE 2 SPRAYS NASALLY DAILY 48 g 1    amLODIPine (NORVASC) 2.5 MG tablet TAKE 1 TABLET DAILY 90 tablet 1    Psyllium (METAMUCIL PO) Take by mouth      triamterene-hydrochlorothiazide (DYAZIDE) 37.5-25 MG per capsule TAKE 1 CAPSULE DAILY 90 capsule 1    Magnesium 250 MG TABS Take 1 tablet by mouth daily      calcium citrate-vitamin D (CITRACAL+D) 315-250 MG-UNIT per tablet Take 2 tablets by mouth daily      albuterol HFA 108 (90 Base) MCG/ACT inhaler Inhale 2 puffs into the lungs every 4 hours as needed for Wheezing   Shake well before each use. 1 Inhaler 0    triamcinolone (KENALOG) 0.025 % lotion Apply topically 2 times daily as needed for Rash 60 mL 1     No current facility-administered medications on file prior to visit.      Allergy History as of 05/03/18     CLARITHROMYCIN       Noted Status Severity Type Reaction    09/29/14 1342 Tilden Fossa 05/01/08 Active Low Allergy Other (See Comments)    Comments:  Reaction not specified             ERYTHROMYCIN       Noted Status Severity Type Reaction    09/29/14 1343 Tilden Fossa 05/01/08 Active Low Allergy Other (See Comments)    Comments:  Reaction not specified              VALDECOXIB       Noted Status Severity Type Reaction    12/04/14 1134 Lacie Scotts, LPN 16/10/96 Active Low Allergy Other (See Comments)    Comments:  unknown           BACITRACIN-POLYMYXIN B       Noted Status Severity Type Reaction    12/04/14 1135 Lacie Scotts, LPN 04/54/09 Active Low Allergy Other (See Comments)    Comments:  unknown               Past Medical History:   Diagnosis Date    Arthritis     Asthma     GERD (gastroesophageal reflux disease)     Hypertension     Thyroid disease        :"Patient's medications, allergies, past medical, surgical, social and family histories were reviewed and updated as appropriate."}    Review of Systems   Constitutional: Negative.    Gastrointestinal: Negative.    Genitourinary:  Negative.            Objective:   Physical Exam   Constitutional: She appears well-developed and well-nourished. No distress.   Cardiovascular: Normal rate, regular rhythm and normal heart sounds.   Pulmonary/Chest: Effort normal.   Abdominal: Soft. Bowel sounds are normal.   Genitourinary:    Vagina and uterus normal.   Cervix exhibits no motion tenderness and no discharge. Right adnexum displays no tenderness and no fullness. Left adnexum displays no fullness.    No vaginal discharge.             Assessment:      Repeat pap       Plan:      Thin prep done, will call with results  Cont current meds  Refuses flu shot  Keep scheduled appt  Knows to call prn

## 2018-05-04 ENCOUNTER — Other Ambulatory Visit: Payer: Self-pay | Admitting: Pediatrics

## 2018-05-05 ENCOUNTER — Other Ambulatory Visit: Payer: Self-pay

## 2018-05-05 ENCOUNTER — Encounter: Payer: Self-pay | Admitting: Gastroenterology

## 2018-05-05 LAB — HM COLONOSCOPY

## 2018-05-12 LAB — SURGICAL PATHOLOGY

## 2018-05-19 LAB — GYN CYTOLOGY

## 2018-05-20 LAB — GYN CYTOLOGY

## 2018-06-21 ENCOUNTER — Other Ambulatory Visit: Payer: Self-pay | Admitting: Pediatrics

## 2018-06-21 ENCOUNTER — Other Ambulatory Visit: Payer: Self-pay | Admitting: Family Medicine

## 2018-06-21 DIAGNOSIS — J309 Allergic rhinitis, unspecified: Secondary | ICD-10-CM

## 2018-06-21 DIAGNOSIS — I10 Essential (primary) hypertension: Secondary | ICD-10-CM

## 2018-06-21 DIAGNOSIS — E039 Hypothyroidism, unspecified: Secondary | ICD-10-CM

## 2018-07-11 ENCOUNTER — Other Ambulatory Visit: Payer: Self-pay | Admitting: Family Medicine

## 2018-09-19 ENCOUNTER — Other Ambulatory Visit: Payer: Self-pay | Admitting: Pediatrics

## 2018-12-08 ENCOUNTER — Other Ambulatory Visit: Payer: Self-pay | Admitting: Pediatrics

## 2018-12-08 ENCOUNTER — Other Ambulatory Visit: Payer: Self-pay | Admitting: Family Medicine

## 2018-12-08 DIAGNOSIS — I10 Essential (primary) hypertension: Secondary | ICD-10-CM

## 2018-12-08 DIAGNOSIS — E039 Hypothyroidism, unspecified: Secondary | ICD-10-CM

## 2018-12-08 DIAGNOSIS — J309 Allergic rhinitis, unspecified: Secondary | ICD-10-CM

## 2018-12-28 ENCOUNTER — Other Ambulatory Visit: Payer: Self-pay | Admitting: Family Medicine

## 2019-02-14 ENCOUNTER — Encounter: Payer: BLUE CROSS/BLUE SHIELD | Admitting: Family Medicine

## 2019-02-23 IMAGING — MG MAMMOGRAPHY SCREENING BILATERAL 3D TOMOSYNTHESIS WITH CAD
8 series · 8 of 24 positions shown · non-contrast
Comparison: 01/21/2018 dating back to 01/21/2016 
BREAST DENSITY: (Level A) The breasts are almost entirely fatty.

MAMMOGRAPHY SCREENING BILATERAL 3D TOMOSYNTHESIS WITH CAD, 02/23/2019 [DATE]: 
CLINICAL INDICATION: Screening exam.
TECHNIQUE: Digital bilateral mammograms and 3-D Tomosynthesis were obtained. 
These were interpreted both primarily and with the aid of computer-aided 
detection system.

[L CC]
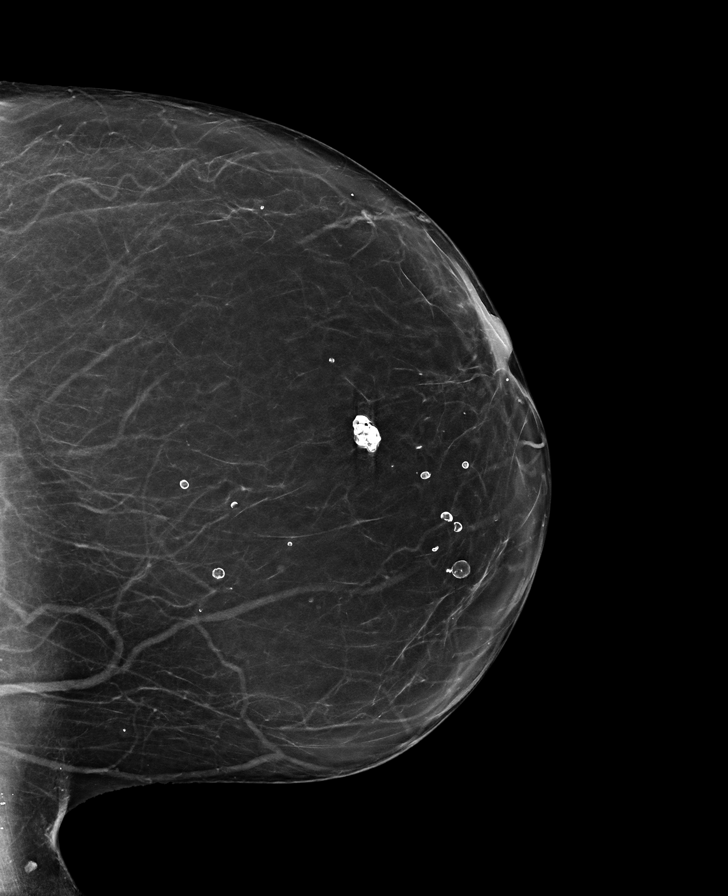

[R MLO]
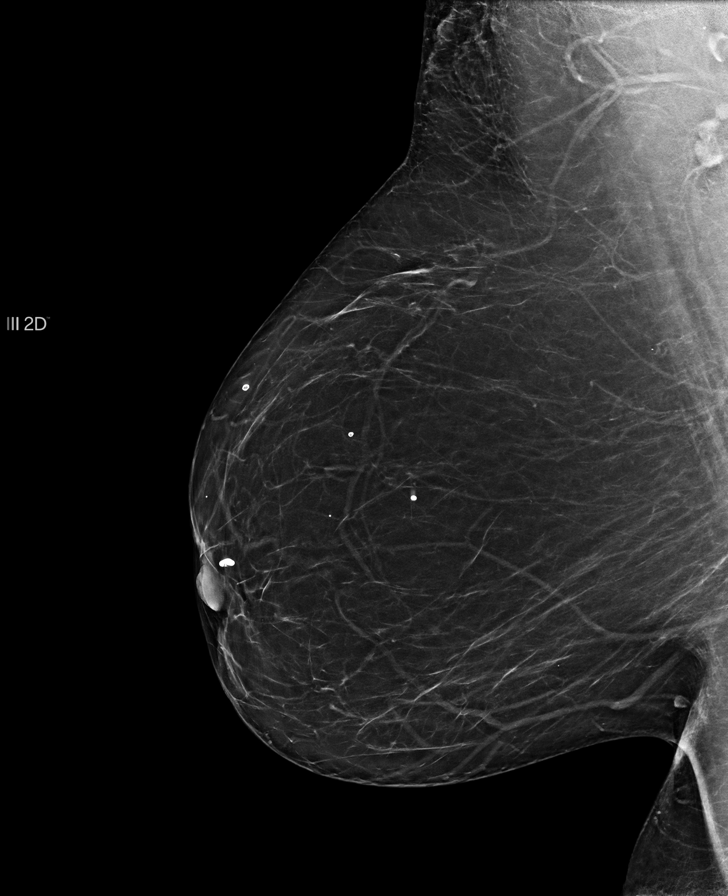

[R CC]
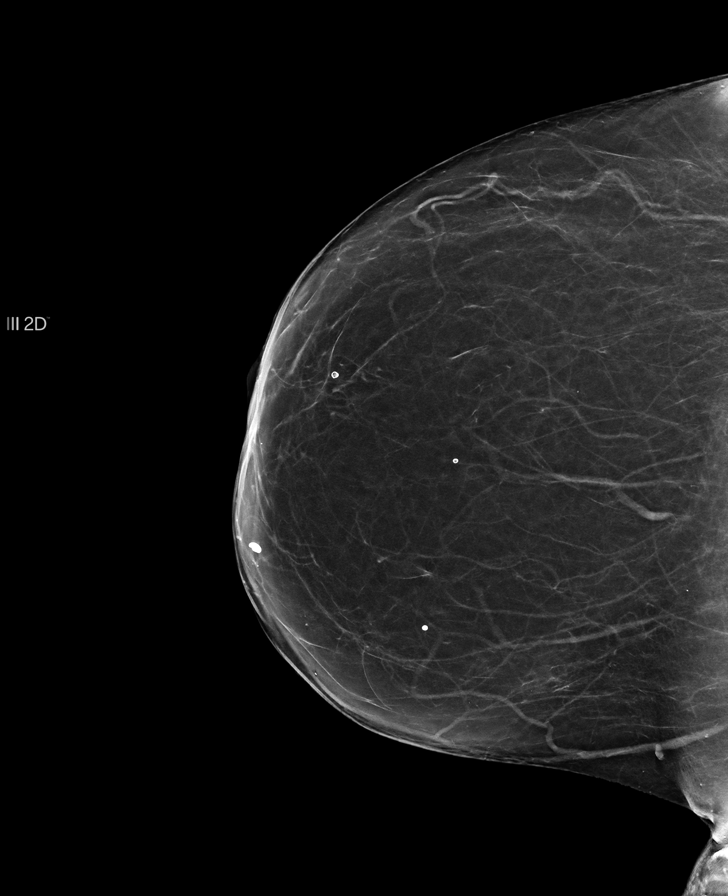

[L MLO]
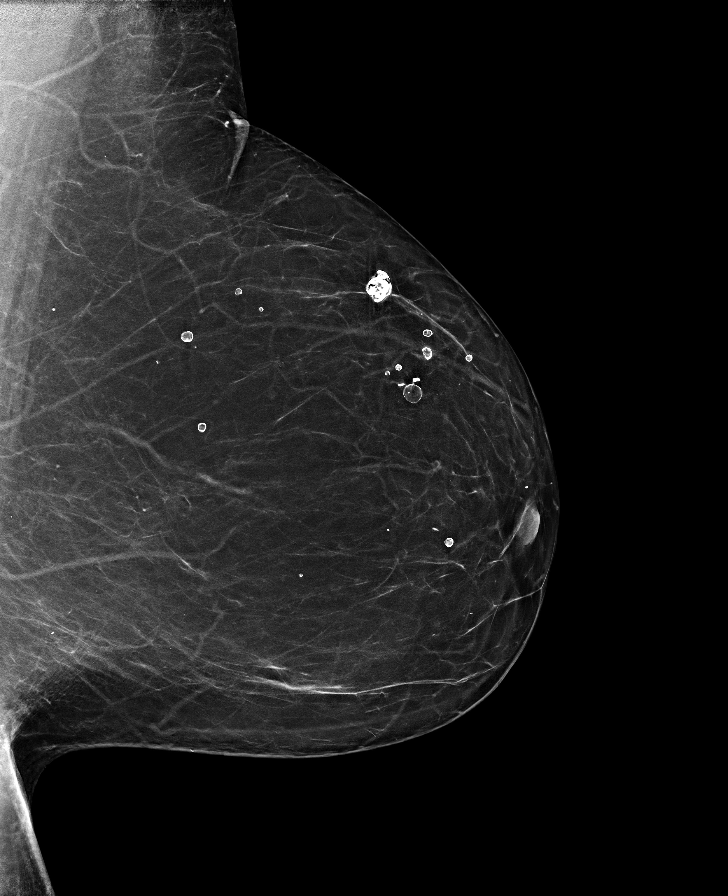

[R CC tomo · tomo slice 33/65.0]
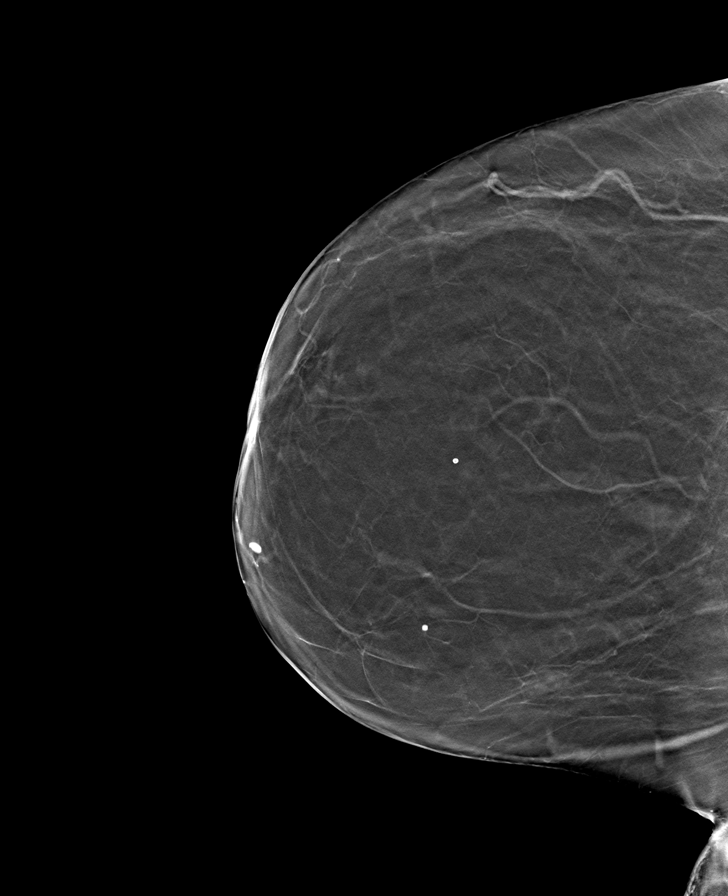

[L MLO tomo · tomo slice 41/80.0]
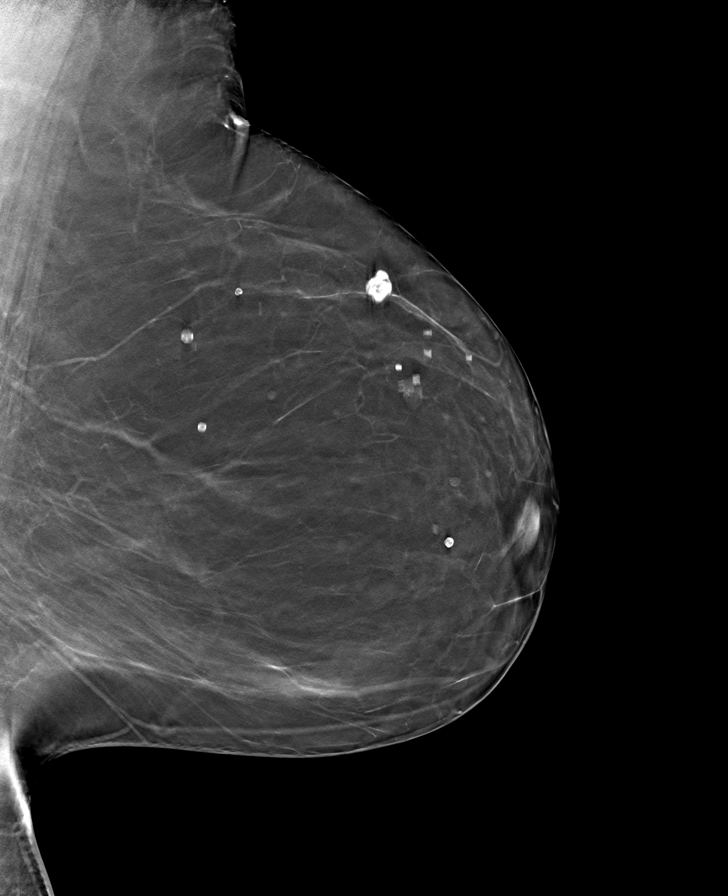

[R MLO tomo · tomo slice 40/79.0]
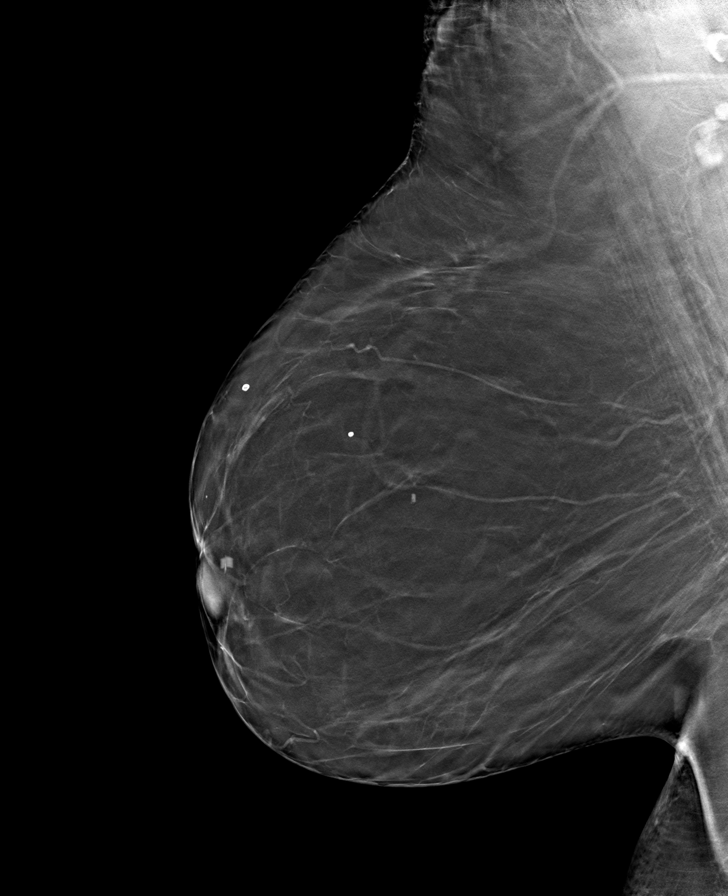

[L CC tomo · tomo slice 35/69.0]
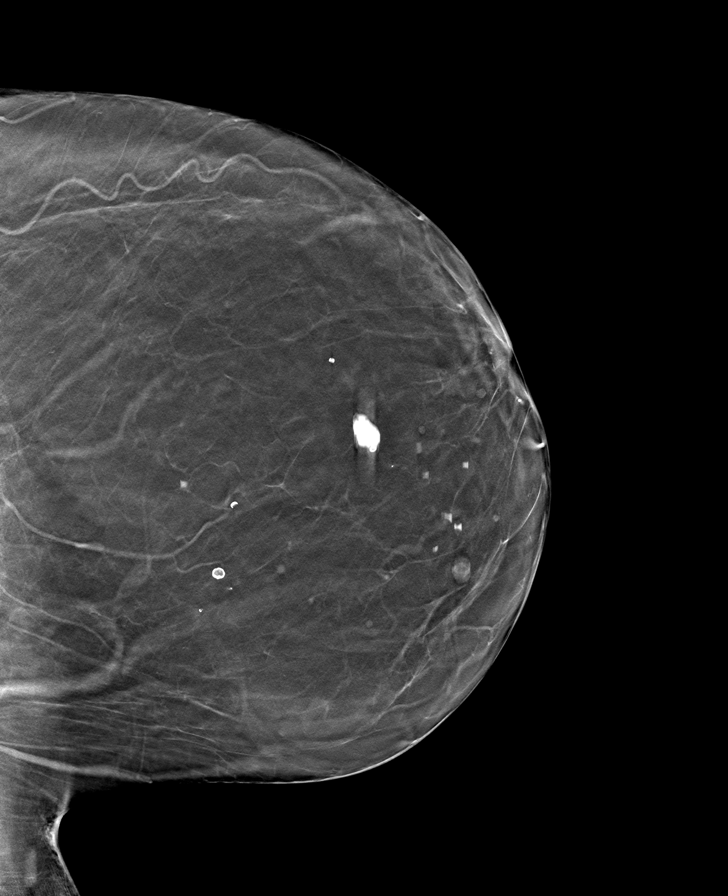

[8 of 24 positions shown; findings below may reference images not displayed]

FINDINGS: Overall stable mammographic appearance. No mass, area of architectural 
distortion or suspicious calcification. No mammographic evidence of malignancy.
IMPRESSION: ( BI-RADS 2) Benign findings. Routine mammographic follow-up is recommended.

## 2019-03-11 ENCOUNTER — Other Ambulatory Visit: Payer: Self-pay | Admitting: Pediatrics

## 2019-06-04 ENCOUNTER — Other Ambulatory Visit: Payer: Self-pay | Admitting: Family Medicine

## 2019-06-04 DIAGNOSIS — J309 Allergic rhinitis, unspecified: Secondary | ICD-10-CM

## 2019-06-04 DIAGNOSIS — I10 Essential (primary) hypertension: Secondary | ICD-10-CM

## 2019-06-04 DIAGNOSIS — E039 Hypothyroidism, unspecified: Secondary | ICD-10-CM

## 2019-06-24 ENCOUNTER — Other Ambulatory Visit: Payer: Self-pay | Admitting: Family Medicine

## 2019-09-02 ENCOUNTER — Other Ambulatory Visit: Payer: Self-pay | Admitting: Family Medicine

## 2019-11-22 ENCOUNTER — Telehealth: Payer: Self-pay | Admitting: Family Medicine

## 2019-11-22 DIAGNOSIS — E039 Hypothyroidism, unspecified: Secondary | ICD-10-CM

## 2019-11-22 DIAGNOSIS — R5383 Other fatigue: Secondary | ICD-10-CM

## 2019-11-22 DIAGNOSIS — I10 Essential (primary) hypertension: Secondary | ICD-10-CM

## 2019-11-22 DIAGNOSIS — M2550 Pain in unspecified joint: Secondary | ICD-10-CM

## 2019-11-22 NOTE — Telephone Encounter (Signed)
Patient aware.

## 2019-11-22 NOTE — Telephone Encounter (Signed)
Ordered labs

## 2019-11-22 NOTE — Telephone Encounter (Signed)
Patient called and rescheduled for PE for Aug and is wondering if she should have Blood work done Prior to this appointment.    Please generate and notify patient if necessary

## 2019-12-01 ENCOUNTER — Other Ambulatory Visit: Payer: Self-pay | Admitting: Family Medicine

## 2019-12-01 ENCOUNTER — Telehealth: Payer: Self-pay | Admitting: Family Medicine

## 2019-12-01 DIAGNOSIS — I10 Essential (primary) hypertension: Secondary | ICD-10-CM

## 2019-12-01 DIAGNOSIS — E039 Hypothyroidism, unspecified: Secondary | ICD-10-CM

## 2019-12-01 DIAGNOSIS — J309 Allergic rhinitis, unspecified: Secondary | ICD-10-CM

## 2019-12-01 NOTE — Telephone Encounter (Signed)
Received refill requests, pt has not been seen since Nov 2019.  Would you like to send in refills?

## 2019-12-01 NOTE — Telephone Encounter (Signed)
OK to do RFs until her appt.  Advise her to get her labs done soon

## 2019-12-01 NOTE — Telephone Encounter (Signed)
Pt is aware.  

## 2019-12-01 NOTE — Telephone Encounter (Addendum)
Sent provider TE    Please do not send the Amlodipine.  She states that this has been changed to Losartan.  (for some reason I can not remove the amlodipine)

## 2019-12-08 NOTE — Telephone Encounter (Signed)
Spoke to pt she reports that she does not need the losartan at this time, she just picked up a script at a pharmacy near her.

## 2019-12-14 ENCOUNTER — Other Ambulatory Visit: Payer: Self-pay | Admitting: Pediatrics

## 2020-01-17 ENCOUNTER — Other Ambulatory Visit: Payer: Self-pay

## 2020-01-17 DIAGNOSIS — M2550 Pain in unspecified joint: Secondary | ICD-10-CM

## 2020-01-17 DIAGNOSIS — E039 Hypothyroidism, unspecified: Secondary | ICD-10-CM

## 2020-01-17 DIAGNOSIS — R5383 Other fatigue: Secondary | ICD-10-CM

## 2020-01-17 DIAGNOSIS — I10 Essential (primary) hypertension: Secondary | ICD-10-CM

## 2020-01-19 ENCOUNTER — Other Ambulatory Visit: Payer: Self-pay | Admitting: Family Medicine

## 2020-01-19 LAB — CBC AND DIFFERENTIAL
Baso # K/uL: 0.05 10*3/uL (ref 0.01–0.08)
Basophil %: 0.6 % (ref 0.1–1.2)
Eos # K/uL: 0.07 10*3/uL (ref 0.04–0.36)
Eosinophil %: 0.8 % (ref 0.7–5.8)
Hematocrit: 45.7 % — ABNORMAL HIGH (ref 34.1–44.9)
Hemoglobin: 14.7 g/dL (ref 11.2–15.7)
IMM Granulocytes #: 0.04 10*3/uL — ABNORMAL HIGH (ref 0.0–0.0)
IMM Granulocytes: 0.5 %
Lymph # K/uL: 2.91 10*3/uL (ref 1.18–3.74)
Lymphocyte %: 35 % (ref 19.3–51.7)
MCH: 31 pg (ref 25.6–32.2)
MCHC: 32.2 g/dl (ref 32.2–35.5)
MCV: 96.4 fl — ABNORMAL HIGH (ref 79.4–94.8)
Mean Platelet Volume: 9 fl — ABNORMAL LOW (ref 9.4–12.3)
Mono # K/uL: 0.69 10*3/uL (ref 0.20–0.90)
Monocyte %: 8.3 % (ref 4.7–12.5)
Neut # K/uL: 4.56 10*3/uL (ref 1.56–6.13)
Nucl RBC # K/uL: 0 10*3/uL (ref 0.00–0.00)
Nucl RBC %: 0 (ref 0.0–0.2)
Platelets: 323 10*3/uL (ref 160–370)
RBC Distribution Width-SD: 46 fl (ref 36.4–46.3)
RBC: 4.74 10*6/uL (ref 3.93–5.22)
RDW: 13 % (ref 11.7–14.4)
Seg Neut %: 54.8 % (ref 34.0–71.1)
WBC: 8.32 10*3/uL (ref 3.98–10.04)

## 2020-01-19 LAB — TSH: TSH: 2.44 u[IU]/mL (ref 0.270–4.200)

## 2020-01-19 LAB — T4, FREE: Free T4: 1.29 ng/dL (ref 0.90–1.70)

## 2020-01-21 LAB — COMPREHENSIVE METABOLIC PANEL
ALT: 16 U/L (ref 0–35)
AST: 18 U/L (ref 0–35)
Albumin: 4 g/dL (ref 3.5–5.2)
Alk Phos: 68 U/L (ref 35–105)
Anion Gap: 10 mmol/L (ref 6–15)
Bilirubin,Total: 0.4 mg/dL (ref 0.0–1.2)
CO2: 28 mmol/L (ref 20–28)
Calcium: 9.3 mg/dL (ref 8.6–10.2)
Chloride: 100 mmol/L (ref 96–108)
Creatinine: 0.81 mg/dL (ref 0.51–0.95)
GFR,Black: 86.13 (ref >60)
GFR,Caucasian: 71.18 (ref >60)
Glucose: 96 mg/dL (ref 60–99)
Lab: 9.8 mg/dL (ref 6.0–20.0)
Potassium: 3.8 mmol/L (ref 3.3–5.1)
Sodium: 138 mmol/L (ref 133–145)
Total Protein: 6.9 g/dL (ref 6.3–7.7)

## 2020-01-21 LAB — LIPID PANEL
Chol/HDL Ratio: 3.64
Cholesterol: 182 mg/dL (ref 0–200)
HDL: 50 mg/dL (ref 40–60)
LDL Calculated: 112.6 mg/dL
Triglycerides: 97 mg/dL (ref 0–150)

## 2020-01-21 LAB — CRP: CRP: 0.79 mg/L (ref 0.0–10.0)

## 2020-01-21 LAB — MAGNESIUM: Magnesium: 2.2 mg/dL (ref 1.6–2.5)

## 2020-01-31 ENCOUNTER — Other Ambulatory Visit: Payer: Self-pay | Admitting: Gastroenterology

## 2020-02-01 ENCOUNTER — Encounter: Payer: Self-pay | Admitting: Family Medicine

## 2020-02-02 ENCOUNTER — Ambulatory Visit: Payer: BLUE CROSS/BLUE SHIELD | Admitting: Family Medicine

## 2020-02-02 ENCOUNTER — Encounter: Payer: Self-pay | Admitting: Family Medicine

## 2020-02-02 VITALS — BP 130/80 | HR 78 | Temp 96.9°F | Ht 62.0 in | Wt 194.0 lb

## 2020-02-02 DIAGNOSIS — D751 Secondary polycythemia: Secondary | ICD-10-CM

## 2020-02-02 DIAGNOSIS — Z0000 Encounter for general adult medical examination without abnormal findings: Secondary | ICD-10-CM

## 2020-02-02 DIAGNOSIS — J4520 Mild intermittent asthma, uncomplicated: Secondary | ICD-10-CM

## 2020-02-02 DIAGNOSIS — I10 Essential (primary) hypertension: Secondary | ICD-10-CM

## 2020-02-02 MED ORDER — ALBUTEROL SULFATE HFA 108 (90 BASE) MCG/ACT IN AERS *I*
2.0000 | INHALATION_SPRAY | RESPIRATORY_TRACT | 3 refills | Status: AC | PRN
Start: 2020-02-02 — End: ?

## 2020-02-02 MED ORDER — TRIAMCINOLONE ACETONIDE 0.025 % EX LOTN *A*
TOPICAL_LOTION | Freq: Two times a day (BID) | CUTANEOUS | 1 refills | Status: DC | PRN
Start: 2020-02-02 — End: 2022-02-11

## 2020-02-02 MED ORDER — LOSARTAN POTASSIUM 50 MG PO TABS *I*
50.0000 mg | ORAL_TABLET | Freq: Every day | ORAL | 2 refills | Status: DC
Start: 2020-02-02 — End: 2020-10-15

## 2020-02-02 NOTE — Progress Notes (Signed)
Subjective:      Patty Robertson is a 64yo white female here for a routine exam and Follow-up fro HTN, Hypothyroid.    Her blood pressures have been good.  No chest pain, palpitations or shortness of breath.  Does try to eat healthy.  Stays physically active.  She does have a physician in [place] who she sees usually once during the winter.  Evidently, her dentist in [place] advised that the calcium channel blockers may be affecting her gums.  She was taken off of the amlodipine but is still on the diltiazem.  She is also continuing on Dyazide.  She is presently on losartan.  Does feel this is helped bring down her blood pressure better than the amlodipine.     +Osteoarthritis in both knees. Did get the Hyalgan shots in both knees last winter and recently got a cortisone injection.  Her knees are feeling good at this time.    Has been feeling good. Spends the winters in [place]. Is physically active there.  Just returned from [place] in June.    Does have some intermittent issues with wheezing.  The albuterol continues to work well.  She does not use it more than 3 times weekly most of the time.     Gynecologic History  Menopausal.  Last Pap: 02/2018. Results were: normal  Last mammogram:01/31/2020 . Results were: normal      Patient's medications, allergies, past medical, surgical, social and family histories were reviewed.    Current Outpatient Medications   Medication    KLOR-CON M20 20 MEQ  tablet    dilTIAZem (CARDIZEM CD, CARTIA XT, DILT-CD) 240 MG 24 hr capsule    SYNTHROID 112 MCG tablet    fluticasone (FLONASE) 50 MCG/ACT nasal spray    triamterene-hydrochlorothiazide (DYAZIDE) 37.5-25 MG per capsule    famotidine (PEPCID) 20 MG tablet    Psyllium (METAMUCIL PO)    Magnesium 250 MG TABS    calcium citrate-vitamin D (CITRACAL+D) 315-250 MG-UNIT per tablet    triamcinolone (KENALOG) 0.025 % lotion    albuterol HFA (PROVENTIL, VENTOLIN, PROAIR HFA) 108 (90 Base) MCG/ACT inhaler     No current facility-administered  medications for this visit.         Review of Systems    Constitutional: She has been feeling good overall.  EENT: No visual disturbance, no recent URI symptoms or sore throat.  Neck: No neck pain or masses.  Pulmonary: No cough or shortness of breath. Rare wheezing.  Cardiac: No chest pain or palpitations  Gastrointestinal: No nausea, vomiting, constipation or diarrhea.  Mild heartburn with certain foods.  Genitourinary: No dysuria or frequency. No vaginal discharge. Minimal stress incontinence, wears a pad.  Musculoskeletal: Knees hurt with walking downhill.   No weakness.  Integument: No rashes.  Neurologic: No seizures or numbness.      Objective:     BP 130/80 (BP Location: Right arm, Patient Position: Sitting, Cuff Size: large adult)    Pulse 78    Temp 36.1 C (96.9 F) (Temporal)    Ht 1.575 m (5\' 2" )    Wt 88 kg (194 lb)    SpO2 98%    BMI 35.48 kg/m     Repeat BP 128/78  General: Well-developed well-nourished white female in no acute distress.  HEENT: Normocephalic; conjunctivae pink, pupils reactive, fundi normal; Nose mild congestion with clear discharge.TMs are normal; pharynx no erythema.  There is some minimal thickening of the gingiva between teeth.  Neck: supple, no adenopathy, thyroid  is symmetrically enlarged without nodules.  Lungs: Clear with decreased breath sounds in both bases.  Heart: Regular rate with soft systolic murmur.  No S3.  Carotids 2+ without bruits.  No JVD.  Breasts: Soft without masses.  Abdomen: Soft, nontender, no hepatosplenomegaly or other masses.  Active bowel sounds.  Back: Normal curvature, nontender.  Straight leg raising negative.  Extremities: Full range of motion, osteoarthritic changes of both knees.  Trace peripheral edema.  Pedal pulses intact.  Feet look good.  Neurologic: Deep tendon reflexes are +2 and symmetrical throughout.  Cranial nerves II through XII are grossly intact.  Strength intact throughout.  Normal sensation in hands and feet.      Assessment:       Healthy female exam.   Hypertension  Hypothyroidism  Osteoarthritis knees  Overweight  Asthma/mild persistent  Allergic rhinitis  Gingival hypertrophy     Plan:     She is to continue all her present medications.  Continue the losartan.  May need to change the diltiazem also.  She does have a follow-up dental appointment in December and will send me a report on the gum disorder at that time.  Continue the Albuterol prn.  Encouraged her to start increasing exercise to help with her knees.  Also encouraged to work on weight loss. Did discuss that she may eventually need knee replacements which she is aware of.  She would rather continue with the injections as long as they continue to work.  Reviewed healthy diet.    Reviewed the results of the labs she had done.  Advised that everything is good except for some mild abnormality in her H&H.  We will just follow-up on this at her next visit.  Advised this is most likely due to some dehydration.     She just had her mammogram.  Colonoscopy is UTD.     She did get her Covid vaccine.    Follow-up in November prior to going back to [place]. we will recheck labs at that time.

## 2020-02-22 ENCOUNTER — Other Ambulatory Visit: Payer: Self-pay | Admitting: Pediatrics

## 2020-03-14 ENCOUNTER — Telehealth: Payer: Self-pay | Admitting: Family Medicine

## 2020-03-14 NOTE — Telephone Encounter (Signed)
Looks like medication has been removed.  Closing this encounter.

## 2020-03-14 NOTE — Telephone Encounter (Signed)
Patty Robertson is calling in regards to she states she is not taking the Amlodipine and would like this to removed from her medication's for she is not taking this any more.  Please advise

## 2020-05-21 ENCOUNTER — Other Ambulatory Visit: Payer: BLUE CROSS/BLUE SHIELD

## 2020-05-21 ENCOUNTER — Other Ambulatory Visit: Payer: Self-pay | Admitting: Family Medicine

## 2020-05-21 DIAGNOSIS — I10 Essential (primary) hypertension: Secondary | ICD-10-CM

## 2020-05-21 LAB — COMPREHENSIVE METABOLIC PANEL
ALT: 13 U/L (ref 0–35)
AST: 17 U/L (ref 0–35)
Albumin: 4 g/dL (ref 3.5–5.2)
Alk Phos: 72 U/L (ref 35–105)
Anion Gap: 9 mmol/L (ref 6–15)
Bilirubin,Total: 0.4 mg/dL (ref 0.0–1.2)
CO2: 28 mmol/L (ref 20–28)
Calcium: 9.1 mg/dL (ref 8.6–10.2)
Chloride: 101 mmol/L (ref 96–108)
Creatinine: 0.78 mg/dL (ref 0.51–0.95)
GFR,Black: 89.97 (ref >60)
GFR,Caucasian: 74.35 (ref >60)
Glucose: 93 mg/dL (ref 60–99)
Lab: 13.4 mg/dL (ref 6.0–20.0)
Potassium: 3.8 mmol/L (ref 3.3–5.1)
Sodium: 138 mmol/L (ref 133–145)
Total Protein: 6.6 g/dL (ref 6.3–7.7)

## 2020-05-21 LAB — CBC AND DIFFERENTIAL
Baso # K/uL: 0.05 10*3/uL (ref 0.01–0.08)
Basophil %: 0.7 % (ref 0.1–1.2)
Eos # K/uL: 0.21 10*3/uL (ref 0.04–0.36)
Eosinophil %: 3 % (ref 0.7–5.8)
Hematocrit: 44.8 % (ref 34.1–44.9)
Hemoglobin: 14.6 g/dL (ref 11.2–15.7)
IMM Granulocytes #: 0.02 10*3/uL — ABNORMAL HIGH (ref 0.0–0.0)
IMM Granulocytes: 0.3 %
Lymph # K/uL: 3.05 10*3/uL (ref 1.18–3.74)
Lymphocyte %: 44.3 % (ref 19.3–51.7)
MCH: 31.3 pg (ref 25.6–32.2)
MCHC: 32.6 g/dl (ref 32.2–35.5)
MCV: 96.1 fl — ABNORMAL HIGH (ref 79.4–94.8)
Mean Platelet Volume: 9.1 fl — ABNORMAL LOW (ref 9.4–12.3)
Mono # K/uL: 0.62 10*3/uL (ref 0.20–0.90)
Monocyte %: 9 % (ref 4.7–12.5)
Neut # K/uL: 2.94 10*3/uL (ref 1.56–6.13)
Nucl RBC # K/uL: 0 10*3/uL (ref 0.00–0.00)
Nucl RBC %: 0 (ref 0.0–0.2)
Platelets: 320 10*3/uL (ref 160–370)
RBC Distribution Width-SD: 41.8 fl (ref 36.4–46.3)
RBC: 4.66 10*6/uL (ref 3.93–5.22)
RDW: 11.9 % (ref 11.7–14.4)
Seg Neut %: 42.7 % (ref 34.0–71.1)
WBC: 6.89 10*3/uL (ref 3.98–10.04)

## 2020-05-21 LAB — LIPID PANEL
Chol/HDL Ratio: 4.52
Cholesterol: 190 mg/dL (ref 0–200)
HDL: 42 mg/dL (ref 40–60)
LDL Calculated: 118.2 mg/dL
Triglycerides: 149 mg/dL (ref 0–150)

## 2020-05-22 ENCOUNTER — Ambulatory Visit: Payer: BLUE CROSS/BLUE SHIELD | Admitting: Family Medicine

## 2020-05-22 ENCOUNTER — Encounter: Payer: Self-pay | Admitting: Family Medicine

## 2020-05-22 ENCOUNTER — Other Ambulatory Visit: Payer: Self-pay | Admitting: Family Medicine

## 2020-05-22 VITALS — BP 138/82 | HR 56 | Temp 97.7°F | Ht 62.0 in | Wt 201.4 lb

## 2020-05-22 DIAGNOSIS — J4520 Mild intermittent asthma, uncomplicated: Secondary | ICD-10-CM

## 2020-05-22 DIAGNOSIS — I10 Essential (primary) hypertension: Secondary | ICD-10-CM

## 2020-05-22 DIAGNOSIS — E039 Hypothyroidism, unspecified: Secondary | ICD-10-CM

## 2020-05-22 NOTE — Progress Notes (Signed)
Patient ID: Patty Robertson is a 64 y.o. year old female.    Chief Complaint   Patient presents with    Follow-up     FUV     HPI:  64yo white female her to recheck Hypthyroid and HTN.    She has felt good overall.  They will be leaving to go to [place] in December.  Will stay down there until May or June.  She denies any chest pain, palpitations or shortness of breath.  Has tried to watch her diet.  Does not really use salt.    Energy level has been fairly good overall.  She is good about remembering to take her Synthroid.  She did recently switched to the generic levothyroxine.    No problems with illnesses over the winter.  She has had no problems with wheezing.  Uses the albuterol rarely.  She has had the Covid vaccines.    She continues to have issues with osteoarthritis in her knees.  They do hurt frequently.  She really only uses the meloxicam as needed.  Takes Tylenol fairly regularly.  Does plan to get cortisone injections again when she gets back to [place].    Her blood pressures at home generally running in the 120s to 130s over 70s to 80s.  She denies any chest pain, palpitations or shortness of breath.    The medication and allergy list was reviewed and is accurate  Allergy / Social History / Medications:  Allergies   Allergen Reactions    Bextra [Valdecoxib] Other (See Comments)     unknown    Biaxin [Clarithromycin] Other (See Comments)     Reaction not specified      Erythromycin Ethylsuccinate [Erythromycin] Other (See Comments)     Reaction not specified      Polysporin [Bacitracin-Polymyxin B] Other (See Comments)     unknown     Social History     Tobacco Use    Smoking status: Never Smoker    Smokeless tobacco: Never Used   Substance Use Topics    Alcohol use: Yes     Alcohol/week: 1.0 standard drink     Types: 1 Glasses of wine per week     Comment: rare     Patient's Medications   New Prescriptions    No medications on file   Previous Medications    ALBUTEROL HFA (PROVENTIL,  VENTOLIN, PROAIR HFA) 108 (90 BASE) MCG/ACT INHALER    Inhale 2 puffs into the lungs every 4 hours as needed for Wheezing Shake well before each use.    CALCIUM CITRATE-VITAMIN D (CITRACAL+D) 315-250 MG-UNIT PER TABLET    Take 2 tablets by mouth daily    DILTIAZEM (CARDIZEM CD, CARTIA XT, DILT-CD) 240 MG 24 HR CAPSULE    TAKE 1 CAPSULE DAILY       (SWALLOW WHOLE. DO NOT     CRUSH OR CHEW)    FAMOTIDINE (PEPCID) 20 MG TABLET    Take 20 mg by mouth 2 times daily    FLUTICASONE (FLONASE) 50 MCG/ACT NASAL SPRAY    USE 2 SPRAYS NASALLY DAILY    KLOR-CON M20 20 MEQ  TABLET    TAKE 1 TABLET TWICE A DAY    LOSARTAN (COZAAR) 50 MG TABLET    Take 1 tablet (50 mg total) by mouth daily    MAGNESIUM 250 MG TABS    Take 1 tablet by mouth daily    PSYLLIUM (METAMUCIL PO)    Take by mouth  SYNTHROID 112 MCG TABLET    TAKE 1 TABLET DAILY    TRIAMCINOLONE (KENALOG) 0.025 % LOTION    Apply topically 2 times daily as needed for Rash    TRIAMTERENE-HYDROCHLOROTHIAZIDE (DYAZIDE) 37.5-25 MG PER CAPSULE    TAKE 1 CAPSULE DAILY   Modified Medications    No medications on file   Discontinued Medications    No medications on file            Physical Exam:  Vitals:    05/22/20 1051   BP: 136/82   Pulse: 56   Temp: 36.5 C (97.7 F)   Weight: 91.3 kg (201 lb 6 oz)   Height: 1.575 m (5\' 2" )     @LASTSAO2 (3)@  Estimated body mass index is 36.83 kg/m as calculated from the following:    Height as of this encounter: 1.575 m (5\' 2" ).    Weight as of this encounter: 91.3 kg (201 lb 6 oz).  BP Readings from Last 3 Encounters:   05/22/20 136/82   02/02/20 130/80   05/03/18 150/90     Wt Readings from Last 3 Encounters:   05/22/20 91.3 kg (201 lb 6 oz)   02/02/20 88 kg (194 lb)   05/03/18 88.5 kg (195 lb)     Repeat BP 38/82.  Weight is up 7 pounds.  General: Well-developed well-nourished white female in no acute distress.  HEENT: Normocephalic; conjunctivae pink, pupils reactive, fundi normal; Nose mild congestion with clear discharge.TMs are  normal; pharynx no erythema.  Neck: supple, no adenopathy, thyroid is symmetrically enlarged without nodules.  Lungs: Clear with decreased breath sounds in both bases.  Heart: Regular rate with soft systolic murmur.  No S3.  Carotids 2+ without bruits.  No JVD.   Abdomen: Soft, nontender, no hepatosplenomegaly or other masses.  Active bowel sounds.  Back: Normal curvature, nontender.  Straight leg raising negative.  Extremities: Full range of motion, osteoarthritic changes of both knees.  Trace peripheral edema.  Pedal pulses intact.  Feet look good.  Neurologic: Deep tendon reflexes are +2 and symmetrical throughout.  Cranial nerves II through XII are grossly intact.  Strength intact throughout.  Normal sensation in hands and feet.     Recent Lab Results:none      Assessment and Plan:     Hypertension  Hypothyroidism  Osteoarthritis knees  Overweight  Asthma/mild intermittent      Orders this visit  No orders of the defined types were placed in this encounter.    She is to continue her present medications.  Advised her to check her blood pressures at home.  Limit salt intake.  She will call if her blood pressure remains elevated.  Did encourage her to work on weight loss.    She is to get fasting labs done this morning.  We'll call her with results.    She will follow up with orthopedist in 05/24/20.  Encouraged to walk while she is in 04/03/20.    Recheck in August as scheduled.  This will be Medicare wellness exam.  We will also do her normal recheck at that time.

## 2020-06-07 ENCOUNTER — Other Ambulatory Visit: Payer: Self-pay | Admitting: Family Medicine

## 2020-06-07 DIAGNOSIS — J309 Allergic rhinitis, unspecified: Secondary | ICD-10-CM

## 2020-08-20 ENCOUNTER — Other Ambulatory Visit: Payer: Self-pay | Admitting: Pediatrics

## 2020-10-15 ENCOUNTER — Other Ambulatory Visit: Payer: Self-pay | Admitting: Family Medicine

## 2020-10-15 ENCOUNTER — Other Ambulatory Visit: Payer: Self-pay | Admitting: Pediatrics

## 2020-10-15 DIAGNOSIS — J309 Allergic rhinitis, unspecified: Secondary | ICD-10-CM

## 2020-10-15 MED ORDER — LOSARTAN POTASSIUM 50 MG PO TABS *I*
50.0000 mg | ORAL_TABLET | Freq: Every day | ORAL | 2 refills | Status: DC
Start: 2020-10-15 — End: 2020-10-22

## 2020-10-15 MED ORDER — POTASSIUM CHLORIDE CRYS CR 20 MEQ PO TBCR *I*
20.0000 meq | ORAL_TABLET | Freq: Two times a day (BID) | ORAL | 1 refills | Status: DC
Start: 2020-10-15 — End: 2020-10-22

## 2020-10-15 NOTE — Telephone Encounter (Signed)
Refill request for klor-con M20 and losartan sent to [facility] [phone] [facility] [id number]

## 2020-10-22 MED ORDER — POTASSIUM CHLORIDE CRYS CR 20 MEQ PO TBCR *I*
20.0000 meq | ORAL_TABLET | Freq: Two times a day (BID) | ORAL | 1 refills | Status: DC
Start: 2020-10-22 — End: 2020-10-25

## 2020-10-22 MED ORDER — LOSARTAN POTASSIUM 50 MG PO TABS *I*
50.0000 mg | ORAL_TABLET | Freq: Every day | ORAL | 2 refills | Status: DC
Start: 2020-10-22 — End: 2021-05-17

## 2020-10-22 NOTE — Addendum Note (Signed)
Addended by: Gwenith Daily on: 10/22/2020 01:25 PM     Modules accepted: Orders

## 2020-10-22 NOTE — Telephone Encounter (Signed)
Patients preferred pharmacy has changed - the requested refills were sent to the old pharmacy ([facility]) - please correct this in the patients chart and resend the scripts to...    Refill request for klor-con M20 and losartan sent to [facility] [phone] [facility] [id number]    please make sure that the meds are not shipped from [facility] the patient when this has been corrected.

## 2020-10-25 ENCOUNTER — Telehealth: Payer: Self-pay | Admitting: Family Medicine

## 2020-10-25 MED ORDER — POTASSIUM CHLORIDE CRYS CR 20 MEQ PO TBCR *I*
20.0000 meq | ORAL_TABLET | Freq: Two times a day (BID) | ORAL | 1 refills | Status: DC
Start: 2020-10-25 — End: 2021-06-04

## 2020-10-25 NOTE — Telephone Encounter (Signed)
Patientis calling stating this medication is out of stock through her mail order they gave her an over ride number to have it sent to her local pharmacy  She would like this send to   [facility]  [address] [place]   [phone]      Over ride number [phone]

## 2020-10-25 NOTE — Telephone Encounter (Signed)
What medication is this about?  

## 2020-10-25 NOTE — Telephone Encounter (Signed)
Order pended

## 2020-10-25 NOTE — Telephone Encounter (Signed)
potassium chloride SA (KLOR-CON M20) 20 mEq tablet (Order #[id number]) on 10/22/20

## 2020-11-16 ENCOUNTER — Other Ambulatory Visit: Payer: Self-pay

## 2020-11-16 DIAGNOSIS — I10 Essential (primary) hypertension: Secondary | ICD-10-CM

## 2020-11-16 DIAGNOSIS — E039 Hypothyroidism, unspecified: Secondary | ICD-10-CM

## 2020-11-16 MED ORDER — DILTIAZEM HCL COATED BEADS 240 MG PO CP24 *I*
240.0000 mg | ORAL_CAPSULE | Freq: Every day | ORAL | 1 refills | Status: DC
Start: 2020-11-16 — End: 2021-03-25

## 2020-11-16 MED ORDER — SYNTHROID 112 MCG PO TABS
112.0000 ug | ORAL_TABLET | Freq: Every day | ORAL | 1 refills | Status: DC
Start: 2020-11-16 — End: 2021-03-25

## 2021-01-01 ENCOUNTER — Other Ambulatory Visit: Payer: Self-pay | Admitting: Family Medicine

## 2021-01-01 MED ORDER — TRIAMTERENE-HCTZ 37.5-25 MG PO CAPS *A*
1.0000 | ORAL_CAPSULE | Freq: Every day | ORAL | 1 refills | Status: DC
Start: 2021-01-01 — End: 2021-02-04

## 2021-01-01 NOTE — Telephone Encounter (Signed)
Refill request for patients Triamterene/hctz

## 2021-01-14 ENCOUNTER — Other Ambulatory Visit
Admission: RE | Admit: 2021-01-14 | Discharge: 2021-01-14 | Disposition: A | Discharge status: Home / Self Care | Payer: Medicare (Managed Care) | Source: Ambulatory Visit | Attending: Family Medicine | Admitting: Family Medicine

## 2021-01-14 DIAGNOSIS — J4520 Mild intermittent asthma, uncomplicated: Secondary | ICD-10-CM | POA: Insufficient documentation

## 2021-01-14 DIAGNOSIS — I10 Essential (primary) hypertension: Secondary | ICD-10-CM | POA: Insufficient documentation

## 2021-01-14 DIAGNOSIS — E039 Hypothyroidism, unspecified: Secondary | ICD-10-CM | POA: Insufficient documentation

## 2021-01-14 LAB — COMPREHENSIVE METABOLIC PANEL
ALT: 17 U/L (ref 0–35)
AST: 21 U/L (ref 0–35)
Albumin: 4.1 g/dL (ref 3.5–5.2)
Alk Phos: 70 U/L (ref 35–105)
Anion Gap: 14 (ref 7–16)
Bilirubin,Total: 0.5 mg/dL (ref 0.0–1.2)
CO2: 23 mmol/L (ref 20–28)
Calcium: 9.3 mg/dL (ref 8.6–10.2)
Chloride: 101 mmol/L (ref 96–108)
Creatinine: 0.81 mg/dL (ref 0.51–0.95)
Glucose: 96 mg/dL (ref 60–99)
Lab: 13 mg/dL (ref 6–20)
Potassium: 3.9 mmol/L (ref 3.4–4.7)
Sodium: 138 mmol/L (ref 133–145)
Total Protein: 6.9 g/dL (ref 6.3–7.7)
eGFR BY CREAT: 80 *

## 2021-01-14 LAB — CBC AND DIFFERENTIAL
Baso # K/uL: 0.1 10*3/uL (ref 0.0–0.1)
Basophil %: 0.6 %
Eos # K/uL: 0.1 10*3/uL (ref 0.0–0.4)
Eosinophil %: 0.8 %
Hematocrit: 45 % (ref 34–45)
Hemoglobin: 14.9 g/dL (ref 11.2–15.7)
IMM Granulocytes #: 0.1 10*3/uL — ABNORMAL HIGH (ref 0.0–0.0)
IMM Granulocytes: 0.6 %
Lymph # K/uL: 3.5 10*3/uL (ref 1.2–3.7)
Lymphocyte %: 36.8 %
MCH: 32 pg (ref 26–32)
MCHC: 33 g/dL (ref 32–36)
MCV: 97 fL — ABNORMAL HIGH (ref 79–95)
Mono # K/uL: 0.7 10*3/uL (ref 0.2–0.9)
Monocyte %: 7.5 %
Neut # K/uL: 5.1 10*3/uL (ref 1.6–6.1)
Nucl RBC # K/uL: 0 10*3/uL (ref 0.0–0.0)
Nucl RBC %: 0 /100 WBC (ref 0.0–0.2)
Platelets: 353 10*3/uL (ref 160–370)
RBC: 4.7 MIL/uL (ref 3.9–5.2)
RDW: 13.4 % (ref 11.7–14.4)
Seg Neut %: 53.7 %
WBC: 9.4 10*3/uL (ref 4.0–10.0)

## 2021-01-14 LAB — MAGNESIUM: Magnesium: 2.1 mg/dL (ref 1.6–2.5)

## 2021-01-14 LAB — LIPID PANEL
Chol/HDL Ratio: 3.6
Cholesterol: 187 mg/dL
HDL: 52 mg/dL (ref 40–60)
LDL Calculated: 118 mg/dL
Non HDL Cholesterol: 135 mg/dL
Triglycerides: 83 mg/dL

## 2021-01-14 LAB — TSH: TSH: 2.2 u[IU]/mL (ref 0.27–4.20)

## 2021-01-31 ENCOUNTER — Other Ambulatory Visit: Payer: Self-pay | Admitting: Gastroenterology

## 2021-02-04 ENCOUNTER — Encounter: Payer: Self-pay | Admitting: Family Medicine

## 2021-02-04 ENCOUNTER — Ambulatory Visit: Payer: Medicare (Managed Care) | Attending: Family Medicine | Admitting: Family Medicine

## 2021-02-04 VITALS — BP 138/86 | HR 59 | Temp 96.6°F | Ht 62.0 in | Wt 199.0 lb

## 2021-02-04 DIAGNOSIS — M170 Bilateral primary osteoarthritis of knee: Secondary | ICD-10-CM | POA: Insufficient documentation

## 2021-02-04 DIAGNOSIS — I10 Essential (primary) hypertension: Secondary | ICD-10-CM | POA: Insufficient documentation

## 2021-02-04 DIAGNOSIS — J4520 Mild intermittent asthma, uncomplicated: Secondary | ICD-10-CM | POA: Insufficient documentation

## 2021-02-04 DIAGNOSIS — Z0000 Encounter for general adult medical examination without abnormal findings: Secondary | ICD-10-CM | POA: Insufficient documentation

## 2021-02-04 DIAGNOSIS — E039 Hypothyroidism, unspecified: Secondary | ICD-10-CM | POA: Insufficient documentation

## 2021-02-04 MED ORDER — TRIAMTERENE-HCTZ 37.5-25 MG PO CAPS *A*
1.0000 | ORAL_CAPSULE | Freq: Every day | ORAL | 1 refills | Status: DC
Start: 2021-02-04 — End: 2021-07-03

## 2021-02-04 NOTE — Progress Notes (Signed)
Patient ID: Patty Robertson is a 65 y.o. year old female.    Chief Complaint   Patient presents with    Initial Annual Medicare visit     HPI:  65yo white female her to recheck Hypthyroid and HTN.    She has felt good overall.  They will be going to [place] again for the winter.  She does plan to leave most likely in early December and will stay until June.  She denies any chest pain, palpitations or shortness of breath.  Has tried to watch her diet.  Does not really use salt.    Energy level has been fairly good overall.  She is good about remembering to take her Synthroid.  She did recently switched to the generic levothyroxine.  They have been extremely busy.  They are remodeling a house which has been physically demanding.  She has been on her knees a lot working.  She does have chronic issues with her knees and this work has aggravated them.  She does see an orthopedist in [place] who does give her cortisone injections.  She was able to get the viscous injections this past winter.  She takes the meloxicam only as needed.  She is asking whether I would be able to give her cortisone injections if they were acting up in November.  She generally has needed cortisone injections about every 6 months.    No problems with illnesses over the winter.  She has had no problems with wheezing.  Uses the albuterol rarely.  She has had the Covid vaccines.    Her blood pressures at home generally running in the 120s to 130s over 70s to 80s.  She denies any chest pain, palpitations or shortness of breath.  She does feel that she gets nervous when she comes in here.    The medication and allergy list was reviewed and is accurate  Allergy / Social History / Medications:  Allergies   Allergen Reactions    Bextra [Valdecoxib] Other (See Comments)     unknown    Biaxin [Clarithromycin] Other (See Comments)     Reaction not specified      Erythromycin Ethylsuccinate [Erythromycin] Other (See Comments)     Reaction not  specified      Polysporin [Bacitracin-Polymyxin B] Other (See Comments)     unknown     Social History     Tobacco Use    Smoking status: Never Smoker    Smokeless tobacco: Never Used   Substance Use Topics    Alcohol use: Yes     Alcohol/week: 1.0 standard drink     Types: 1 Glasses of wine per week     Comment: rare     Patient's Medications   New Prescriptions    No medications on file   Previous Medications    ALBUTEROL HFA (PROVENTIL, VENTOLIN, PROAIR HFA) 108 (90 BASE) MCG/ACT INHALER    Inhale 2 puffs into the lungs every 4 hours as needed for Wheezing Shake well before each use.    CALCIUM CITRATE-VITAMIN D (CITRACAL+D) 315-250 MG-UNIT PER TABLET    Take 2 tablets by mouth daily    DILTIAZEM (CARDIZEM CD, CARTIA XT, DILT-CD) 240 MG 24 HR CAPSULE    Take 1 capsule (240 mg total) by mouth daily   Swallow whole. Do not crush or chew.    FAMOTIDINE (PEPCID) 20 MG TABLET    Take 20 mg by mouth 2 times daily    FLUTICASONE (FLONASE) 50  MCG/ACT NASAL SPRAY    USE 2 SPRAYS NASALLY DAILY    LOSARTAN (COZAAR) 50 MG TABLET    Take 1 tablet (50 mg total) by mouth daily    MAGNESIUM 250 MG TABS    Take 1 tablet by mouth daily    POTASSIUM CHLORIDE SA (KLOR-CON M20) 20 MEQ  TABLET    Take 1 tablet (20 mEq total) by mouth 2 times daily    PSYLLIUM (METAMUCIL PO)    Take by mouth    SYNTHROID 112 MCG TABLET    Take 1 tablet (112 mcg total) by mouth daily    TRIAMCINOLONE (KENALOG) 0.025 % LOTION    Apply topically 2 times daily as needed for Rash   Modified Medications    Modified Medication Previous Medication    TRIAMTERENE-HYDROCHLOROTHIAZIDE (DYAZIDE) 37.5-25 MG PER CAPSULE triamterene-hydrochlorothiazide (DYAZIDE) 37.5-25 MG per capsule       Take 1 capsule by mouth daily    Take 1 capsule by mouth daily   Discontinued Medications    No medications on file            Physical Exam:  Vitals:    02/04/21 1126   BP: 138/86   Pulse: 59   Temp: 35.9 C (96.6 F)   Weight: 90.3 kg (199 lb)   Height: 1.575 m (5\' 2" )      @LASTSAO2 (3)@  Estimated body mass index is 36.4 kg/m as calculated from the following:    Height as of this encounter: 1.575 m (5\' 2" ).    Weight as of this encounter: 90.3 kg (199 lb).  BP Readings from Last 3 Encounters:   02/04/21 138/86   05/22/20 138/82   02/02/20 130/80     Wt Readings from Last 3 Encounters:   02/04/21 90.3 kg (199 lb)   05/22/20 91.3 kg (201 lb 6 oz)   02/02/20 88 kg (194 lb)     Repeat BP 38/82.  Weight is down 3 pounds.  General: Well-developed well-nourished white female in no acute distress.  HEENT: Normocephalic; conjunctivae pink, pupils reactive, fundi normal; Nose mild congestion with clear discharge.TMs are normal; pharynx no erythema.  Neck: supple, no adenopathy, thyroid is symmetrically enlarged without nodules.  Lungs: Clear with decreased breath sounds in both bases.  Heart: Regular rate with soft systolic murmur.  No S3.  Carotids 2+ without bruits.  No JVD.   Abdomen: Soft, nontender, no hepatosplenomegaly or other masses.  Active bowel sounds.  Back: Normal curvature, nontender.  Straight leg raising negative.  Extremities: Full range of motion, osteoarthritic changes of both knees, right greater than left.  Trace peripheral edema.  Pedal pulses intact.  Feet look good.  Neurologic: Deep tendon reflexes are +2 and symmetrical throughout.  Cranial nerves II through XII are grossly intact.  Strength intact throughout.  Normal sensation in hands and feet.     Recent Lab Results:none      Assessment and Plan:     Hypertension  Hypothyroidism  Osteoarthritis knees  Overweight  Asthma/mild intermittent      Orders this visit  Orders Placed This Encounter   Procedures    Comprehensive metabolic panel    Lipid Panel (Reflex to Direct  LDL if Triglycerides more than 400)    Magnesium    TSH     She is to continue her present medications.  Advised her to check her blood pressures at home.  Limit salt intake.  She will call if her blood pressure becomes elevated.  Did  encourage her to work on weight loss.    Did discuss the her osteoarthritis in her knees.  Advised her that I could give her cortisone injections if needed as long as its been over 4 months.    Did review her blood work.  Everything is excellent.    Recheck in 3 to 4 months.  Will consider knee injections at that time.

## 2021-02-04 NOTE — Progress Notes (Signed)
Visit performed as:             Office Visit, met with patient in person    Today we reviewed and updated Patty Robertson smoking status, activities of daily living, depression screen, fall risk, medications and allergies.   I have counseled the patient in the above areas.     Subjective:     Chief Complaint: Patty Robertson is a 65 y.o. female here for a/an Initial Annual Medicare visit    In general, Patty Robertson rates their overall health as:  excellent      Patient Care Team:  Winferd Humphrey, MD as PCP - General  Nickola Major, MD as Surgeon (Gastroenterology)     Current Outpatient Medications on File Prior to Visit   Medication Sig Dispense Refill    triamterene-hydrochlorothiazide (DYAZIDE) 37.5-25 MG per capsule Take 1 capsule by mouth daily 90 capsule 1    dilTIAZem (CARDIZEM CD, CARTIA XT, DILT-CD) 240 mg 24 hr capsule Take 1 capsule (240 mg total) by mouth daily   Swallow whole. Do not crush or chew. 90 capsule 1    SYNTHROID 112 MCG tablet Take 1 tablet (112 mcg total) by mouth daily 90 tablet 1    potassium chloride SA (KLOR-CON M20) 20 mEq  tablet Take 1 tablet (20 mEq total) by mouth 2 times daily 180 tablet 1    losartan (COZAAR) 50 mg tablet Take 1 tablet (50 mg total) by mouth daily 90 tablet 2    fluticasone (FLONASE) 50 MCG/ACT nasal spray USE 2 SPRAYS NASALLY DAILY 48 g 1    famotidine (PEPCID) 20 MG tablet Take 20 mg by mouth 2 times daily      Psyllium (METAMUCIL PO) Take by mouth      Magnesium 250 MG TABS Take 1 tablet by mouth daily      calcium citrate-vitamin D (CITRACAL+D) 315-250 MG-UNIT per tablet Take 2 tablets by mouth daily      triamcinolone (KENALOG) 0.025 % lotion Apply topically 2 times daily as needed for Rash 60 mL 1    albuterol HFA (PROVENTIL, VENTOLIN, PROAIR HFA) 108 (90 Base) MCG/ACT inhaler Inhale 2 puffs into the lungs every 4 hours as needed for Wheezing Shake well before each use. 1 each 3     No current facility-administered medications  on file prior to visit.     Allergies   Allergen Reactions    Bextra [Valdecoxib] Other (See Comments)     unknown    Biaxin [Clarithromycin] Other (See Comments)     Reaction not specified      Erythromycin Ethylsuccinate [Erythromycin] Other (See Comments)     Reaction not specified      Polysporin [Bacitracin-Polymyxin B] Other (See Comments)     unknown     There are no problems to display for this patient.    Past Medical History:   Diagnosis Date    Arthritis     Asthma     GERD (gastroesophageal reflux disease)     Hypertension     Thyroid disease      Past Surgical History:   Procedure Laterality Date    APPENDECTOMY      CHOLECYSTECTOMY      COLONOSCOPY      DILATION AND CURETTAGE OF UTERUS      TUBAL LIGATION       Family History   Problem Relation Age of Onset    Hypertension Father     Colon  cancer Father     Cancer Father     Heart failure Father     Diabetes Father     Heart Disease Father     High Blood Pressure Father     Colon cancer Paternal Grandfather     Heart failure Paternal Grandfather     Heart Disease Paternal Grandfather     Arthritis Mother     Cancer Mother     GERD Mother     Arthritis Paternal Grandmother     Cancer Paternal Grandmother     Heart failure Paternal Grandmother     Heart Disease Paternal Grandmother     High Blood Pressure Paternal Grandmother      Social History     Socioeconomic History    Marital status: Married     Spouse name: Lynann Bologna    Number of children: 1    Years of education: 14    Highest education level: Not on file   Occupational History    Occupation: Retired/Teachers Aide   Tobacco Use    Smoking status: Never Smoker    Smokeless tobacco: Never Used   Substance and Sexual Activity    Alcohol use: Yes     Alcohol/week: 1.0 standard drink     Types: 1 Glasses of wine per week     Comment: rare    Drug use: No     Comment: No familiy hx of drug or alcohol abuse    Sexual activity: Yes     Partners: Male     Birth  control/protection: None   Social History Narrative    Not on file       Objective:     Vital Signs: BP 138/86 (BP Location: Left arm, Patient Position: Sitting, Cuff Size: large adult)    Pulse 59    Temp 35.9 C (96.6 F) (Temporal)    Ht 1.575 m (_0 )    Wt 90.3 kg (199 lb)    SpO2 97%    BMI 36.40 kg/m    BMI: Body mass index is 36.4 kg/m.    Vision Screening Results (Welcome visit only):  No exam data present    Depression Screening Results:  Recent Review Flowsheet Data     PHQ Scores 02/04/2021 02/02/2020 03/03/2018 11/25/2016 06/05/2016 02/01/2016    PSQ2 Q1 - Interest/Pleasure - _1     PSQ2 Q2 - Down, Depressed, Hopeless - _2     PHQ Calculated Score 0 - - - - -        Opioid Use/DAST- 10 Screening Results:   How many times in the past year have you used an illegal drug or used a prescription medication for nonmedical reasons?: 0 (02/04/2021 11:29 AM)    Activities of Daily Living/Functional Screening Results:  Is the person deaf or does he/she have serious difficulty hearing?: N  Is this person blind or does he/she have serious difficulty seeing even when wearing glasses?: N  *Vision Status: No impairment  Does this person have serious difficulty walking or climbing stairs?: N  Does this person have difficulty dressing or bathing?: N  *Shopping: Independent  *House Keeping: Independent  *Managing Own Medications: Independent  *Handling Finances: Independent  Difficulty doing errands due to a physicial, mental or emotional condition: No  Difficulty remembering or making decisions due to a physicial, mental or emotional condition: No      Fall Risk Screening Results:  Have you fallen in the last  year?: No  Do you feel you are at risk for falling?: No      Assessment and Plan:     Cognitive Function:  Recall of recent and remote events appears:  Normal      Advanced Care Planning:  Discussed with patient.  She does have a healthcare proxy at home.  Will bring me in a copy.     The following health  maintenance plan was reviewed with the patient:    Health Maintenance Topics with due status: Overdue       Topic Date Due    HIV Screening USPSTF/[place] Never done    Hepatitis C Screening USPSTF/[place] Never done    Osteoporosis Screening USPSTF Never done    IMM-ZOSTER Never done    COVID-19 Vaccine 08/30/2020    IMM-PREVNAR VACCINE 41 + YRS Never done    IMM-PNEUMOVAX VACCINE 31 + YRS Never done    Fall Risk Screening Never done     Health Maintenance Topics with due status: Due Soon       Topic Date Due    Cervical Cancer Screening USPSTF 05/04/2021     Health Maintenance Topics with due status: Not Due       Topic Last Completion Date    Colon Cancer Screening Other 05/05/2018    Breast Cancer Screening Other 01/31/2021    DEPRESSION SCREEN YEARLY 02/04/2021    IMM-INFLUENZA Not Due     This health maintenance schedule, identified risks, a list of orders placed today and patient goals have been provided to Patty Robertson in the after visit summary.     Plan for any concerns identified during screening or risk assessments:  Encouraged to work on diet and weight loss.  Congratulated on staying as active as she does.  Discussed immunizations.  She would like to get the Shingrix and Pneumovax done in [place].  She also is advised to get the COVID-vaccine booster when the new one comes out this fall.

## 2021-02-04 NOTE — Patient Instructions (Signed)
Thank you for completing your Initial Annual Medicare visit   with [place] today.     The purpose of this visits was to:     Screen for disease   Assess risk of future medical problems   Help develop a healthy lifestyle   Update vaccines   Get to know your doctor in case of an illness    Patient Care Team:  Beatris Ship, MD as PCP - Lennie Odor, MD as Surgeon (Gastroenterology)     Medicare 5 Year Plan    The following items were identified as areas of concern during your screening today:  BMI greater than 25 - This is a risk for Heart Attack, Stroke, High Blood Pressure, Diabetes, High Cholesterol and other complications.       The Health Maintenance table below identifies screening tests and immunizations recommended by your health care team:  Health Maintenance: These screening recommendations are based on USPSTF, [employer], and [place] state guidelines   Topic Date Due    HIV Screening  Never done    Hepatitis C Screening  Never done    Osteoporosis Screening  Never done    Shingles Vaccine (1 of 2) Never done    COVID-19 Vaccine (4 - Booster for Pfizer series) 08/30/2020    Adult Prevnar Vaccination (1) Never done    Adult Pneumovax Vaccine (1) Never done    Fall Risk Screening  Never done    Cervical Cancer Screening   05/04/2021    Flu Shot (1) 02/28/2021    Breast Cancer Screening  01/31/2022    DEPRESSION SCREEN YEARLY  02/04/2022    Colon Cancer Screening  05/06/2023     In addition, goals and orders placed to address these recommendations are listed in the "Today's Visit" section.    We wish you the best of health and look forward to seeing you again next year for your Annual Medicare Wellness Visit.     If you have any health care concerns before then, please do not hesitate to contact us.

## 2021-03-11 ENCOUNTER — Telehealth: Payer: Self-pay | Admitting: Family Medicine

## 2021-03-11 NOTE — Telephone Encounter (Signed)
She can try taking the meloxicam.  Should take on an as-needed basis.  Would advise she take for a few days and then stop for few days.  This can make her blood pressure increase.

## 2021-03-11 NOTE — Telephone Encounter (Signed)
Patient verbalizes understanding of below information.

## 2021-03-11 NOTE — Telephone Encounter (Signed)
Patient called with a  Question from her my chart notes from her August visit.  She was asking if the Meloxicam that was discussed is something she could try.  She stated that her husband has a script for Meloxicam 7.5mg  and she would like to try that rather than getting a new script that she might not be able to tolerate.   Please advise patient.

## 2021-03-23 ENCOUNTER — Other Ambulatory Visit: Payer: Self-pay | Admitting: Pediatrics

## 2021-03-23 DIAGNOSIS — E039 Hypothyroidism, unspecified: Secondary | ICD-10-CM

## 2021-03-23 DIAGNOSIS — I10 Essential (primary) hypertension: Secondary | ICD-10-CM

## 2021-05-09 ENCOUNTER — Encounter: Payer: Self-pay | Admitting: Family Medicine

## 2021-05-09 ENCOUNTER — Other Ambulatory Visit: Payer: Self-pay

## 2021-05-09 ENCOUNTER — Ambulatory Visit: Payer: Medicare (Managed Care) | Attending: Family Medicine | Admitting: Family Medicine

## 2021-05-09 VITALS — BP 110/76 | HR 67 | Temp 97.8°F | Wt 194.0 lb

## 2021-05-09 DIAGNOSIS — E039 Hypothyroidism, unspecified: Secondary | ICD-10-CM | POA: Insufficient documentation

## 2021-05-09 DIAGNOSIS — J4520 Mild intermittent asthma, uncomplicated: Secondary | ICD-10-CM | POA: Insufficient documentation

## 2021-05-09 DIAGNOSIS — I10 Essential (primary) hypertension: Secondary | ICD-10-CM | POA: Insufficient documentation

## 2021-05-09 DIAGNOSIS — M170 Bilateral primary osteoarthritis of knee: Secondary | ICD-10-CM | POA: Insufficient documentation
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
## 2021-05-10 ENCOUNTER — Telehealth: Payer: Self-pay | Admitting: Family Medicine

## 2021-05-10 NOTE — Telephone Encounter (Signed)
Patient called asking if someone could please call her to let her know if she still needs to get her bloodwork done and if she needs to fast for it?  Kaya's call back is [phone].

## 2021-05-10 NOTE — Telephone Encounter (Signed)
Patient is aware 

## 2021-05-10 NOTE — Telephone Encounter (Signed)
TSH, Magnesium and CMP were place 02/04/21.   Patient was just seen yesterday and is not scheduled for an upcoming appt.     When would you like patient to get these and would you like her to be fasting?

## 2021-05-10 NOTE — Telephone Encounter (Signed)
Tried contacting pt, no answer. LVM with ext

## 2021-05-10 NOTE — Telephone Encounter (Signed)
Does not need to be fasting.  She had been told that she could go yesterday after the appointment.  She can go anytime within the next couple weeks.

## 2021-05-14 ENCOUNTER — Other Ambulatory Visit
Admission: RE | Admit: 2021-05-14 | Discharge: 2021-05-14 | Disposition: A | Discharge status: Home / Self Care | Payer: Medicare (Managed Care) | Source: Ambulatory Visit | Attending: Family Medicine | Admitting: Family Medicine

## 2021-05-14 ENCOUNTER — Encounter: Payer: Self-pay | Admitting: Family Medicine

## 2021-05-14 DIAGNOSIS — E039 Hypothyroidism, unspecified: Secondary | ICD-10-CM | POA: Insufficient documentation

## 2021-05-14 DIAGNOSIS — I10 Essential (primary) hypertension: Secondary | ICD-10-CM | POA: Insufficient documentation

## 2021-05-14 LAB — COMPREHENSIVE METABOLIC PANEL
ALT: 14 U/L (ref 0–35)
AST: 18 U/L (ref 0–35)
Albumin: 4.2 g/dL (ref 3.5–5.2)
Alk Phos: 72 U/L (ref 35–105)
Anion Gap: 14 (ref 7–16)
Bilirubin,Total: 0.4 mg/dL (ref 0.0–1.2)
CO2: 23 mmol/L (ref 20–28)
Calcium: 9.4 mg/dL (ref 8.6–10.2)
Chloride: 101 mmol/L (ref 96–108)
Creatinine: 0.86 mg/dL (ref 0.51–0.95)
Glucose: 119 mg/dL — ABNORMAL HIGH (ref 60–99)
Lab: 12 mg/dL (ref 6–20)
Potassium: 3.8 mmol/L (ref 3.4–4.7)
Sodium: 138 mmol/L (ref 133–145)
Total Protein: 7.1 g/dL (ref 6.3–7.7)
eGFR BY CREAT: 75 *

## 2021-05-14 LAB — TSH: TSH: 2.19 u[IU]/mL (ref 0.27–4.20)

## 2021-05-14 LAB — MAGNESIUM: Magnesium: 2.1 mg/dL (ref 1.6–2.5)

## 2021-05-16 ENCOUNTER — Other Ambulatory Visit: Payer: Self-pay | Admitting: Pediatrics

## 2021-06-02 ENCOUNTER — Other Ambulatory Visit: Payer: Self-pay | Admitting: Pediatrics

## 2021-07-02 ENCOUNTER — Other Ambulatory Visit: Payer: Self-pay | Admitting: Family Medicine

## 2021-07-20 ENCOUNTER — Other Ambulatory Visit: Payer: Self-pay | Admitting: Pediatrics

## 2021-07-20 DIAGNOSIS — J309 Allergic rhinitis, unspecified: Secondary | ICD-10-CM

## 2021-10-29 ENCOUNTER — Telehealth: Payer: Self-pay | Admitting: Family Medicine

## 2021-10-29 NOTE — Telephone Encounter (Signed)
Left message for Pt call the office for scheduling   She over due to an appointment with Dr. Vonita Moss.

## 2021-10-29 NOTE — Telephone Encounter (Signed)
Pt called back She is all scheduled for     Future Encounters      02/11/2022 12:15 PM Sch    SUBSEQUENT MEDICARE WELLNESS    [facility]    Beatris Ship, MD

## 2021-12-16 ENCOUNTER — Other Ambulatory Visit: Payer: Self-pay | Admitting: Pediatrics

## 2021-12-16 DIAGNOSIS — I10 Essential (primary) hypertension: Secondary | ICD-10-CM

## 2021-12-16 DIAGNOSIS — E039 Hypothyroidism, unspecified: Secondary | ICD-10-CM

## 2022-02-03 ENCOUNTER — Other Ambulatory Visit: Payer: Self-pay | Admitting: Gastroenterology

## 2022-02-05 ENCOUNTER — Telehealth: Payer: Self-pay | Admitting: Family Medicine

## 2022-02-05 DIAGNOSIS — E039 Hypothyroidism, unspecified: Secondary | ICD-10-CM

## 2022-02-05 DIAGNOSIS — I10 Essential (primary) hypertension: Secondary | ICD-10-CM

## 2022-02-05 DIAGNOSIS — J4520 Mild intermittent asthma, uncomplicated: Secondary | ICD-10-CM

## 2022-02-05 NOTE — Telephone Encounter (Signed)
Patient aware.

## 2022-02-05 NOTE — Telephone Encounter (Signed)
Let her know I have ordered labs

## 2022-02-05 NOTE — Telephone Encounter (Signed)
Patient wonders if labs should be ordered prior to the next visit.  Please notify patient of Dr Enos Fling decision    Future Encounters     02/11/2022 12:15 PM Sch    SUBSEQUENT MEDICARE Lanetta Inch, MD

## 2022-02-07 ENCOUNTER — Other Ambulatory Visit
Admission: RE | Admit: 2022-02-07 | Discharge: 2022-02-07 | Disposition: A | Discharge status: Home / Self Care | Payer: Medicare (Managed Care) | Source: Ambulatory Visit | Attending: Family Medicine | Admitting: Family Medicine

## 2022-02-07 DIAGNOSIS — I10 Essential (primary) hypertension: Secondary | ICD-10-CM | POA: Insufficient documentation

## 2022-02-07 DIAGNOSIS — J4520 Mild intermittent asthma, uncomplicated: Secondary | ICD-10-CM | POA: Insufficient documentation

## 2022-02-07 DIAGNOSIS — E039 Hypothyroidism, unspecified: Secondary | ICD-10-CM | POA: Insufficient documentation

## 2022-02-07 LAB — CBC
Hematocrit: 46 % — ABNORMAL HIGH (ref 34–45)
Hemoglobin: 15.3 g/dL (ref 11.2–15.7)
MCH: 32 pg (ref 26–32)
MCHC: 33 g/dL (ref 32–36)
MCV: 96 fL — ABNORMAL HIGH (ref 79–95)
Platelets: 315 10*3/uL (ref 160–370)
RBC: 4.9 MIL/uL (ref 3.9–5.2)
RDW: 11.9 % (ref 11.7–14.4)
WBC: 8.2 10*3/uL (ref 4.0–10.0)

## 2022-02-07 LAB — COMPREHENSIVE METABOLIC PANEL
ALT: 12 U/L (ref 0–35)
AST: 17 U/L (ref 0–35)
Albumin: 4.2 g/dL (ref 3.5–5.2)
Alk Phos: 59 U/L (ref 35–105)
Anion Gap: 13 (ref 7–16)
Bilirubin,Total: 0.4 mg/dL (ref 0.0–1.2)
CO2: 26 mmol/L (ref 20–28)
Calcium: 9.9 mg/dL (ref 8.6–10.2)
Chloride: 100 mmol/L (ref 96–108)
Creatinine: 0.82 mg/dL (ref 0.51–0.95)
Glucose: 101 mg/dL — ABNORMAL HIGH (ref 60–99)
Lab: 17 mg/dL (ref 6–20)
Potassium: 3.9 mmol/L (ref 3.3–4.6)
Sodium: 139 mmol/L (ref 133–145)
Total Protein: 7.2 g/dL (ref 6.3–7.7)
eGFR BY CREAT: 79 *

## 2022-02-07 LAB — TSH: TSH: 0.52 u[IU]/mL (ref 0.27–4.20)

## 2022-02-07 LAB — LIPID PANEL
Chol/HDL Ratio: 4
Cholesterol: 201 mg/dL — AB
HDL: 50 mg/dL (ref 40–60)
LDL Calculated: 124 mg/dL
Non HDL Cholesterol: 151 mg/dL
Triglycerides: 133 mg/dL

## 2022-02-07 LAB — T4, FREE: Free T4: 1.5 ng/dL (ref 0.9–1.7)

## 2022-02-07 LAB — MAGNESIUM: Magnesium: 2.1 mg/dL (ref 1.6–2.5)

## 2022-02-08 ENCOUNTER — Other Ambulatory Visit: Payer: Self-pay | Admitting: Family Medicine

## 2022-02-11 ENCOUNTER — Other Ambulatory Visit: Payer: Self-pay

## 2022-02-11 ENCOUNTER — Ambulatory Visit: Payer: Medicare (Managed Care) | Attending: Family Medicine | Admitting: Family Medicine

## 2022-02-11 ENCOUNTER — Encounter: Payer: Self-pay | Admitting: Family Medicine

## 2022-02-11 VITALS — BP 128/82 | HR 61 | Temp 97.7°F | Ht 62.0 in | Wt 188.0 lb

## 2022-02-11 DIAGNOSIS — R739 Hyperglycemia, unspecified: Secondary | ICD-10-CM | POA: Insufficient documentation

## 2022-02-11 DIAGNOSIS — E039 Hypothyroidism, unspecified: Secondary | ICD-10-CM

## 2022-02-11 DIAGNOSIS — Z0000 Encounter for general adult medical examination without abnormal findings: Secondary | ICD-10-CM | POA: Insufficient documentation

## 2022-02-11 DIAGNOSIS — R011 Cardiac murmur, unspecified: Secondary | ICD-10-CM

## 2022-02-11 DIAGNOSIS — I10 Essential (primary) hypertension: Secondary | ICD-10-CM

## 2022-02-11 MED ORDER — TRIAMCINOLONE ACETONIDE 0.025 % EX LOTN *A*
TOPICAL_LOTION | Freq: Two times a day (BID) | CUTANEOUS | 1 refills | Status: AC | PRN
Start: 2022-02-11 — End: ?

## 2022-02-11 NOTE — Patient Instructions (Signed)
Thank you for completing your Subsequent Annual Medicare Visit   with [place] today.     The purpose of this visits was to:    ? Screen for disease  ? Assess risk of future medical problems  ? Help develop a healthy lifestyle  ? Update vaccines  ? Get to know your doctor in case of an illness    Patient Care Team:  Beatris Ship, MD as PCP - Lennie Odor, MD as Surgeon (Gastroenterology)     Medicare 5 Year Plan    The following items were identified as areas of concern during your screening today:  BMI greater than 25 - This is a risk for Heart Attack, Stroke, High Blood Pressure, Diabetes, High Cholesterol and other complications.       The Health Maintenance table below identifies screening tests and immunizations recommended by your health care team:  Health Maintenance: These screening recommendations are based on USPSTF, [employer], and [place] state guidelines   Topic Date Due   ? HIV Screening  Never done   ? Hepatitis C Screening  Never done   ? DTaP/Tdap/Td Vaccines (1 - Tdap) Never done   ? Shingles Vaccine (1 of 2) Never done   ? Fall Risk Screening  Never done   ? COVID-19 Vaccine (5 - Pfizer series) 09/09/2021   ? Flu Shot (1) 02/28/2022   ? Breast Cancer Screening  02/04/2023   ? Depression Screen Yearly  02/12/2023   ? Colon Cancer Screening  05/06/2023   ? Pneumococcal Vaccination  Completed     In addition, goals and orders placed to address these recommendations are listed in the "Today's Visit" section.    We wish you the best of health and look forward to seeing you again next year for your Annual Medicare Wellness Visit.     If you have any health care concerns before then, please do not hesitate to contact us.

## 2022-02-11 NOTE — Progress Notes (Signed)
+Stress. Father has been living with them and he has been sick.  Is undergoing cataract surgery.   Had Covid in Dec 2022.         Patient ID: Patty Robertson is a 66 y.o. year old female.    Chief Complaint   Patient presents with   ? Subsequent Annual Medicare Visit     HPI:  66 year old white female to recheck hypothyroidism and hypertension.    She has been having significant amount of stress over the past year.  Her father-in-law is living with them because his home was destroyed in [place].  He has had a lot of medical issues over this past year.  Her husband had a knee replacement and has been somewhat disabled for a few months and she has had to take care of him.  She just had cataract surgery done on her right eye and is scheduled in a few weeks to have the left eye done.  She had gotten so she could barely see out of her right eye.    She has had no chest pain, palpitations or shortness of breath.  Her blood pressures have been good when she is checked them at home.    She does feel her energy level overall is good.  She is good about taking her thyroid medicine regularly.    Does have some issues with pain in her knees.  She has not been able to exercise much due to the multiple stressors.  She is hoping to get back to walking regularly fairly soon.    Her last illness was December 2022 when she had COVID.  She got this within 4 weeks of having the COVID booster.  She is questioning what she should do about getting another COVID-vaccine.      The medication and allergy list was reviewed and is accurate  Allergy / Social History / Medications:  Allergies   Allergen Reactions   ? Bextra [Valdecoxib] Other (See Comments)     unknown   ? Biaxin [Clarithromycin] Other (See Comments)     Reaction not specified     ? Erythromycin Ethylsuccinate [Erythromycin] Other (See Comments)     Reaction not specified     ? Polysporin [Bacitracin-Polymyxin B] Other (See Comments)     unknown     Social History     Tobacco  Use   ? Smoking status: Never     Passive exposure: Never   ? Smokeless tobacco: Never   Substance Use Topics   ? Alcohol use: Yes     Alcohol/week: 1.0 standard drink of alcohol     Types: 1 Glasses of wine per week     Comment: rare     Patient's Medications   New Prescriptions    No medications on file   Previous Medications    ALBUTEROL HFA (PROVENTIL, VENTOLIN, PROAIR HFA) 108 (90 BASE) MCG/ACT INHALER    Inhale 2 puffs into the lungs every 4 hours as needed for Wheezing Shake well before each use.    CALCIUM CITRATE-VITAMIN D (CITRACAL+D) 315-250 MG-UNIT PER TABLET    Take 2 tablets by mouth daily    DILTIAZEM (CARDIZEM CD, CARTIA XT, DILT-CD) 240 MG 24 HR CAPSULE    TAKE 1 CAPSULE BY MOUTH  DAILY . SWALLOW WHOLE. DO  NOT CRUSH OR CHEW    FAMOTIDINE (PEPCID) 20 MG TABLET    Take 1 tablet (20 mg total) by mouth 2 times daily    FLUTICASONE (  FLONASE) 50 MCG/ACT NASAL SPRAY    USE 2 SPRAYS NASALLY DAILY    LOSARTAN (COZAAR) 50 MG TABLET    TAKE 1 TABLET BY MOUTH DAILY    MAGNESIUM 250 MG TABS    Take 1 tablet (250 mg total) by mouth daily    POTASSIUM CHLORIDE SA (KLOR-CON M20) 20 MEQ  TABLET    TAKE 1 TABLET BY MOUTH TWICE  DAILY    PSYLLIUM (METAMUCIL PO)    Take by mouth    SYNTHROID 112 MCG TABLET    TAKE 1 TABLET BY MOUTH  DAILY    TRIAMTERENE-HYDROCHLOROTHIAZIDE (DYAZIDE) 37.5-25 MG PER CAPSULE    TAKE 1 CAPSULE BY MOUTH  DAILY   Modified Medications    Modified Medication Previous Medication    TRIAMCINOLONE (KENALOG) 0.025 % LOTION triamcinolone (KENALOG) 0.025 % lotion       Apply topically 2 times daily as needed for Rash    Apply topically 2 times daily as needed for Rash   Discontinued Medications    No medications on file          Physical Exam:  Vitals:    02/11/22 1219   BP: 128/82   Pulse: 61   Temp: 36.5 ?C (97.7 ?F)   Weight: 85.3 kg (188 lb)   Height: 1.575 m (5\' 2" )     @LASTSAO2 (3)@  Estimated body mass index is 34.39 kg/m? as calculated from the following:    Height as of this encounter:  1.575 m (5\' 2" ).    Weight as of this encounter: 85.3 kg (188 lb).  BP Readings from Last 3 Encounters:   02/11/22 128/82   05/09/21 110/76   02/04/21 138/86     Wt Readings from Last 3 Encounters:   02/11/22 85.3 kg (188 lb)   05/09/21 88 kg (194 lb)   02/04/21 90.3 kg (199 lb)     Repeat BP 38/82.  Weight is down 6 pounds.  General: Well-developed well-nourished white female in no acute distress.  HEENT: Normocephalic; conjunctivae pink with some mild injection of R. Fundi grossly normal.  Nose mild congestion with clear discharge.TMs are normal; pharynx no erythema.  Neck: supple, no adenopathy, thyroid is symmetrically enlarged without nodules.  Lungs: Clear with decreased breath sounds in both bases.  Heart: Regular rate with 1-2/6 systolic murmur.  No S3.  Carotids 2+ without bruits.  No JVD.   Abdomen: Soft, nontender, no hepatosplenomegaly or other masses.  Active bowel sounds.  Back: Normal curvature, nontender.  Straight leg raising negative.  Extremities: Full range of motion, osteoarthritic changes of both knees, right greater than left.  Trace peripheral edema.   Neurologic: Deep tendon reflexes are +2 and symmetrical throughout.  Cranial nerves II through XII are grossly intact.  Strength intact throughout.  Normal sensation in hands and feet.       Recent Lab Results:none      Assessment and Plan:     Hypertension  Hypothyroid  Cataracts bilateral eyes  Osteoarthritis knees  Overweight  Asthma/mild intermittent  Heart murmur      Orders this visit  Orders Placed This Encounter   Procedures   ? Comprehensive metabolic panel   ? Magnesium   ? Lipid Panel (Reflex to Direct  LDL if Triglycerides more than 400)   ? TSH   ? T4, free   ? Hemoglobin A1c   ? Echo Complete       I did advise her that her heart murmur is a  little louder than it has been in the past.  We will check an echocardiogram.    Her blood work is all good other than for a mild elevation in her blood sugar and cholesterol.  We will have  her recheck blood work in November before she heads back for [place].  Did encourage her to get back to exercising.  Reviewed healthy diet.    She will continue follow-up for her knees with the orthopedist in [place].    She is to continue on all her present medications.  Her blood pressure is good.    Counseling done concerning her stress.  Hopefully this will improve with time.    Use the albuterol as needed.    Recheck here in November.

## 2022-02-11 NOTE — Progress Notes (Signed)
Visit performed as:             Office Visit, met with patient in person    Today we reviewed and updated Patty Robertson?s smoking status, activities of daily living, depression screen, fall risk, medications and allergies.   I have counseled the patient in the above areas.     Subjective:     Chief Complaint: Patty Robertson is a 66 y.o. female here for a/an Subsequent Annual Medicare Visit    In general, Barrett Shell rates their overall health as:  excellent      Patient Care Team:  Beatris Ship, MD as PCP - Lennie Odor, MD as Surgeon (Gastroenterology)     Current Outpatient Medications on File Prior to Visit   Medication Sig Dispense Refill   ? potassium chloride SA (KLOR-CON M20) 20 mEq  tablet TAKE 1 TABLET BY MOUTH TWICE  DAILY 200 tablet 2   ? losartan (COZAAR) 50 mg tablet TAKE 1 TABLET BY MOUTH DAILY 100 tablet 2   ? dilTIAZem (CARDIZEM CD, CARTIA XT, DILT-CD) 240 mg 24 hr capsule TAKE 1 CAPSULE BY MOUTH  DAILY . SWALLOW WHOLE. DO  NOT CRUSH OR CHEW 100 capsule 2   ? SYNTHROID 112 MCG tablet TAKE 1 TABLET BY MOUTH  DAILY 100 tablet 2   ? triamterene-hydrochlorothiazide (DYAZIDE) 37.5-25 MG per capsule TAKE 1 CAPSULE BY MOUTH  DAILY 90 capsule 3   ? famotidine (PEPCID) 20 MG tablet Take 1 tablet (20 mg total) by mouth 2 times daily     ? Psyllium (METAMUCIL PO) Take by mouth     ? Magnesium 250 MG TABS Take 1 tablet (250 mg total) by mouth daily     ? calcium citrate-vitamin D (CITRACAL+D) 315-250 MG-UNIT per tablet Take 2 tablets by mouth daily     ? fluticasone (FLONASE) 50 MCG/ACT nasal spray USE 2 SPRAYS NASALLY DAILY 48 g 1   ? triamcinolone (KENALOG) 0.025 % lotion Apply topically 2 times daily as needed for Rash 60 mL 1   ? albuterol HFA (PROVENTIL, VENTOLIN, PROAIR HFA) 108 (90 Base) MCG/ACT inhaler Inhale 2 puffs into the lungs every 4 hours as needed for Wheezing Shake well before each use. 1 each 3     No current facility-administered medications on file prior to  visit.     Allergies   Allergen Reactions   ? Bextra [Valdecoxib] Other (See Comments)     unknown   ? Biaxin [Clarithromycin] Other (See Comments)     Reaction not specified     ? Erythromycin Ethylsuccinate [Erythromycin] Other (See Comments)     Reaction not specified     ? Polysporin [Bacitracin-Polymyxin B] Other (See Comments)     unknown     There are no problems to display for this patient.    Past Medical History:   Diagnosis Date   ? Arthritis    ? Asthma    ? GERD (gastroesophageal reflux disease)    ? Hypertension    ? Thyroid disease      Past Surgical History:   Procedure Laterality Date   ? APPENDECTOMY     ? CHOLECYSTECTOMY     ? COLONOSCOPY     ? DILATION AND CURETTAGE OF UTERUS     ? TUBAL LIGATION       Family History   Problem Relation Age of Onset   ? Hypertension Father    ? Colon cancer Father    ?  Cancer Father    ? Heart failure Father    ? Diabetes Father    ? Heart Disease Father    ? High Blood Pressure Father    ? Colon cancer Paternal Grandfather    ? Heart failure Paternal Grandfather    ? Heart Disease Paternal Grandfather    ? Arthritis Mother    ? Cancer Mother    ? GERD Mother    ? Arthritis Paternal Grandmother    ? Cancer Paternal Grandmother    ? Heart failure Paternal Grandmother    ? Heart Disease Paternal Grandmother    ? High Blood Pressure Paternal Grandmother      Social History     Socioeconomic History   ? Marital status: Married     Spouse name: Lollie Sails   ? Number of children: 1   ? Years of education: 68   Occupational History   ? Occupation: Retired/Teachers Aide   Tobacco Use   ? Smoking status: Never     Passive exposure: Never   ? Smokeless tobacco: Never   Substance and Sexual Activity   ? Alcohol use: Yes     Alcohol/week: 1.0 standard drink of alcohol     Types: 1 Glasses of wine per week     Comment: rare   ? Drug use: No     Comment: No familiy hx of drug or alcohol abuse   ? Sexual activity: Yes     Partners: Male     Birth control/protection: None        Objective:     Vital Signs: BP 128/82 (BP Location: Left arm, Patient Position: Sitting, Cuff Size: large adult)   Pulse 61   Temp 36.5 ?C (97.7 ?F) (Temporal)   Ht 1.575 m (5\' 2" )   Wt 85.3 kg (188 lb)   SpO2 97%   BMI 34.39 kg/m?    BMI: Body mass index is 34.39 kg/m?Marland Kitchen    Vision Screening Results (Welcome visit only):  Is undergoing cataract surgery.    Depression Screening Results:  Review Flowsheet  More data may exist       02/11/2022 02/04/2021 02/02/2020 03/03/2018 11/25/2016 06/05/2016 02/01/2016   PHQ Scores   PSQ2 Q1 - Interest/Pleasure - - N N N N N   PSQ2 Q2 - Down, Depressed, Hopeless - - N N N N N   PHQ Calculated Score 0 0 - - - - -     Opioid Use/DAST- 10 Screening Results:   How many times in the past year have you used an illegal drug or used a prescription medication for nonmedical reasons?: 0 (02/11/2022 12:21 PM)    Activities of Daily Living/Functional Screening Results:  Is the person deaf or does he/she have serious difficulty hearing?: N  Is this person blind or does he/she have serious difficulty seeing even when wearing glasses?: N  *Vision Status: Visual aid   Does this person have serious difficulty walking or climbing stairs?: N  Does this person have difficulty dressing or bathing?: N  *Shopping: Independent  *House Keeping: Independent  *Managing Own Medications: Independent  *Handling Finances: Independent  Difficulty doing errands due to a physicial, mental or emotional condition: No  Difficulty remembering or making decisions due to a physicial, mental or emotional condition: No      Fall Risk Screening Results:  Have you fallen in the last year?: No  Do you feel you are at risk for falling?: No      Assessment  and Plan:     Cognitive Function:  Recall of recent and remote events appears:  Normal      Advanced Care Planning:  Has a healthcare proxy at home.  Will bring this in to get scanned into her chart.     The following health maintenance plan was reviewed with the  patient:    Health Maintenance Topics with due status: Overdue       Topic Date Due    HIV Screening USPSTF/[place] Never done    Hepatitis C Screening USPSTF/ Never done    IMM DTaP/Tdap/Td Never done    IMM-ZOSTER Never done    Fall Risk Screening Never done    COVID-19 Vaccine 09/09/2021     Health Maintenance Topics with due status: Not Due       Topic Last Completion Date    Colon Cancer Screening Other 05/05/2018    Breast Cancer Screening Other 02/03/2022    Depression Screen Yearly 02/11/2022    IMM-INFLUENZA Not Due     Health Maintenance Topics with due status: Completed       Topic Last Completion Date    IMM Pneumo: 65+ Years 01/17/2022     This health maintenance schedule, identified risks, a list of orders placed today and patient goals have been provided to Barrett Shell in the after visit summary.     Plan for any concerns identified during screening or risk assessments:  Encouraged her to work on weight loss.  She did get the Pneumovax recently.  Does have plans to get the Shingrix within the next several months.  Is up-to-date on health maintenance.

## 2022-02-13 ENCOUNTER — Encounter: Payer: Self-pay | Admitting: Family Medicine

## 2022-03-20 ENCOUNTER — Other Ambulatory Visit: Payer: Self-pay | Admitting: Gastroenterology

## 2022-03-24 ENCOUNTER — Encounter: Payer: Medicare (Managed Care) | Admitting: Cardiology

## 2022-03-24 ENCOUNTER — Ambulatory Visit
Admission: RE | Admit: 2022-03-24 | Discharge: 2022-03-24 | Disposition: A | Discharge status: Home / Self Care | Payer: Medicare (Managed Care) | Source: Ambulatory Visit | Attending: Cardiology | Admitting: Cardiology

## 2022-03-24 ENCOUNTER — Other Ambulatory Visit: Payer: Self-pay

## 2022-03-24 DIAGNOSIS — R011 Cardiac murmur, unspecified: Secondary | ICD-10-CM

## 2022-03-24 LAB — ECHO COMPLETE
AV Area (LV SV Mtd): 1.64 cm2
AV Area (LV SV) BSA Index: 0.85 cm2/m2
AV Area (LVOT SR Mtd): 1.54 cm2
AV Area (LVOT SV Mtd): 1.61 cm2
AV CWD VTI: 44.6 cm
AV CWD Velocity (Mean): 145 cm/s
AV CWD Velocity (Peak): 198 cm/s
AV Gradient (mean): 7.4 mmHg
AV Gradient (peak): 12.6 mmHg
Aortic Diameter (mid tubular): 2.9 cm
Aortic Diameter (sino-tubular junction): 2.5 cm
Aortic Diameter (sinus of Valsalva): 3.3 cm
BMI: 34.5 kg/m2
BSA: 1.93 m2
Deceleration Time - MV: 192 ms
E/A ratio: 0.96
Heart Rate: 53 {beats}/min
Height: 62 in
IVC Diameter: 1.2 mm
Interventricular Septum Systolic Thickness: 1.29 cm
LA Diameter BSA Index: 1.7 cm/m2
LA Diameter Height Index: 2.1 cm/m
LA Diameter: 3.3 cm
LA Systolic Vol BSA Index: 22.8 mL/m2
LA Systolic Vol Height Index: 27.9 mL/m
LA Systolic Volume: 44 mL
LAA Orifice Area: 15.6 cm2
LV ASE Mass BSA Index: 56.9 gm/m2
LV ASE Mass Height 2.7 Index: 32.2 gm/m2.7
LV ASE Mass Height Index: 69.7 gm/m
LV ASE Mass: 109.8 gm
LV CO BSA Index: 2 L/min/m2
LV Cardiac Output: 3.87 L/min
LV Diastolic Volume Index: 57.5 mL/m2
LV Posterior Wall Thickness: 0.9 cm
LV SV - LVOT SV Diff: 1.41 mL
LV SV - LVOT SV Diff: 1.93 %
LV SV BSA Index: 37.8 mL/m2
LV SV Height Index: 46.4 mL/m
LV Septal Thickness: 0.8 cm
LV Stroke Volume: 73 mL
LV Systolic Volume Index: 19.7 mL/m2
LV wall/cavity ratio: 0.4
LVED Diameter BSA Index: 2.18 cm/m2
LVED Diameter Height Index: 2.67 cm/m
LVED Diameter: 4.2 cm
LVED Volume BSA Index: 57.5 mL/m2
LVED Volume BSA Index: 58 ml/m2
LVED Volume Height Index: 70.5 mL/m
LVED Volume: 111 mL
LVEF (Volume): 66 %
LVES Diameter BSA Index: 1.35 cm/m2
LVES Diameter Height Index: 1.65 cm/m
LVES Diameter: 2.6 cm
LVES Volume BSA Index: 19.7 mL/m2
LVES Volume BSA Index: 20 ml/m2
LVES Volume Height Index: 24.1 mL/m
LVES Volume: 38 mL
LVOT + AV Gradient (mean): 9.1 mmHg
LVOT + AV Gradient (peak): 15.7 mmHg
LVOT Area (calculated): 3.46 cm2
LVOT Cardiac Index: 1.97 L/min/m2
LVOT Cardiac Output: 3.79 L/min
LVOT Diameter: 1.8 cm
LVOT Diameter: 2.1 cm
LVOT PWD VTI: 20.68 cm
LVOT PWD Velocity (mean): 65.3 cm/s
LVOT PWD Velocity (peak): 88.1 cm/s
LVOT SV BSA Index: 37.09 mL/m2
LVOT SV Height Index: 45.5 mL/m
LVOT Stroke Rate (mean): 226.1 mL/s
LVOT Stroke Rate (peak): 305 mL/s
LVOT Stroke Volume: 71.59 cc
LVOT/AV Velocity Ratio: 0.44
Left Ventricle Posterior Wall Systolic Thickness: 1.04 cm
MR Regurgitant Fraction (LV SV Mtd): 0.02
MR Regurgitant Volume (LV SV Mtd): 1.4 mL
MV Peak A Velocity: 67.9 cm/s
MV Peak E Velocity: 65.3 cm/s
Mitral Annular E/Ea Vel Ratio: 8.16
Mitral Annular Ea Velocity: 8 cm/s
RA Volume BSA Index: 13.5 mL/m2
RA Volume Height Index: 16.5 mL/m
RA Volume: 26 mL
RR Interval: 1132.08 ms
RVED Diameter BSA Index: 1.6 cm/m2
RVED Diameter Height Index: 2 cm/m
RVED Diameter: 3.14 cm
SEM (LVOT Mean) mN-s: 49.11 mN-s
SEM (LVOT peak) mN-s: 66.26 mN-s
Tricuspid Annular S velocity: 10 cm/s
Weight (lbs): 188 [lb_av]
Weight: 3008 oz

## 2022-04-03 ENCOUNTER — Other Ambulatory Visit: Payer: Self-pay | Admitting: Pediatrics

## 2022-04-22 ENCOUNTER — Telehealth: Payer: Self-pay | Admitting: Family Medicine

## 2022-04-22 NOTE — Telephone Encounter (Signed)
Can put her in with Patty Robertson

## 2022-04-22 NOTE — Telephone Encounter (Signed)
Patient is calling in regards to she needs an appointment before 05/29/22 for she will be leaving state on thanksgiving.  Patient states she was told to call the office when she knew what her plans were.  Is there a date and time you could get patient in sooner.

## 2022-04-22 NOTE — Telephone Encounter (Signed)
Called and spoke to patient   Future Encounters      05/07/2022  9:00 AM Sch    FOLLOW UP VISIT    [facility]    Iran Planas, NP

## 2022-04-25 ENCOUNTER — Other Ambulatory Visit
Admission: RE | Admit: 2022-04-25 | Discharge: 2022-04-25 | Disposition: A | Discharge status: Home / Self Care | Payer: Medicare (Managed Care) | Source: Ambulatory Visit | Attending: Family Medicine | Admitting: Family Medicine

## 2022-04-25 DIAGNOSIS — I10 Essential (primary) hypertension: Secondary | ICD-10-CM | POA: Insufficient documentation

## 2022-04-25 DIAGNOSIS — R739 Hyperglycemia, unspecified: Secondary | ICD-10-CM | POA: Insufficient documentation

## 2022-04-25 DIAGNOSIS — E039 Hypothyroidism, unspecified: Secondary | ICD-10-CM | POA: Insufficient documentation

## 2022-04-25 LAB — COMPREHENSIVE METABOLIC PANEL
ALT: 12 U/L (ref 0–35)
AST: 19 U/L (ref 0–35)
Albumin: 4.1 g/dL (ref 3.5–5.2)
Alk Phos: 65 U/L (ref 35–105)
Anion Gap: 11 (ref 7–16)
Bilirubin,Total: 0.4 mg/dL (ref 0.0–1.2)
CO2: 28 mmol/L (ref 20–28)
Calcium: 9.2 mg/dL (ref 8.6–10.2)
Chloride: 101 mmol/L (ref 96–108)
Creatinine: 0.82 mg/dL (ref 0.51–0.95)
Glucose: 96 mg/dL (ref 60–99)
Lab: 13 mg/dL (ref 6–20)
Potassium: 3.7 mmol/L (ref 3.3–4.6)
Sodium: 140 mmol/L (ref 133–145)
Total Protein: 6.8 g/dL (ref 6.3–7.7)
eGFR BY CREAT: 79 *

## 2022-04-25 LAB — MAGNESIUM: Magnesium: 2 mg/dL (ref 1.6–2.5)

## 2022-04-25 LAB — LIPID PANEL
Chol/HDL Ratio: 3.9
Cholesterol: 189 mg/dL
HDL: 48 mg/dL (ref 40–60)
LDL Calculated: 116 mg/dL
Non HDL Cholesterol: 141 mg/dL
Triglycerides: 124 mg/dL

## 2022-04-25 LAB — T4, FREE: Free T4: 1.5 ng/dL (ref 0.9–1.7)

## 2022-04-25 LAB — HEMOGLOBIN A1C: Hemoglobin A1C: 5.5 %

## 2022-04-25 LAB — TSH: TSH: 0.78 u[IU]/mL (ref 0.27–4.20)

## 2022-05-07 ENCOUNTER — Encounter: Payer: Self-pay | Admitting: Pediatrics

## 2022-05-07 ENCOUNTER — Other Ambulatory Visit: Payer: Self-pay

## 2022-05-07 ENCOUNTER — Ambulatory Visit: Payer: Medicare (Managed Care) | Attending: Pediatrics | Admitting: Pediatrics

## 2022-05-07 VITALS — BP 120/72 | HR 73 | Temp 97.2°F | Ht 62.0 in | Wt 192.6 lb

## 2022-05-07 DIAGNOSIS — K219 Gastro-esophageal reflux disease without esophagitis: Secondary | ICD-10-CM

## 2022-05-07 DIAGNOSIS — I10 Essential (primary) hypertension: Secondary | ICD-10-CM

## 2022-05-07 DIAGNOSIS — J45909 Unspecified asthma, uncomplicated: Secondary | ICD-10-CM

## 2022-05-07 DIAGNOSIS — E663 Overweight: Secondary | ICD-10-CM

## 2022-05-07 DIAGNOSIS — E039 Hypothyroidism, unspecified: Secondary | ICD-10-CM

## 2022-05-07 NOTE — Progress Notes (Signed)
Subjective:     Patient ID: Patty Robertson is a 66 y.o. female.    HPI  Here for recheck asthma, HTN, GERD and hypothyroid    Asthma- no cough or congestion.  No sob.  Non smoker.  Had covid once over a year ago.     HTN- no headache. No dizziness. No sob.   No chest pain    GERD- takes famotidine with good control. Knows what foods to avoid    Hypothyroid- no increased fatigue or sweating    Will be going back to [place] soon,  stays until about July.    Does have provider in [facility]  has a past medical history of Arthritis, Asthma, GERD (gastroesophageal reflux disease), Hypertension, and Thyroid disease.  [name] has a current medication list which includes the following prescription(s): prednisolone acetate, triamterene-hydrochlorothiazide, triamcinolone, potassium chloride sa, losartan, diltiazem, synthroid, fluticasone, famotidine, psyllium, magnesium, calcium citrate-vitamin d, and albuterol hfa.  Yannis is allergic to bextra [valdecoxib], biaxin [clarithromycin], erythromycin ethylsuccinate [erythromycin], and polysporin [bacitracin-polymyxin b].    Review of Systems   Constitutional:  Positive for unexpected weight change. Fever: up 4.  HENT: Negative.     Eyes: Negative.    Respiratory: Negative.     Cardiovascular: Negative.    Gastrointestinal: Negative.    Endocrine: Negative.    Genitourinary: Negative.    Neurological: Negative.    Psychiatric/Behavioral: Negative.       BP Readings from Last 3 Encounters:   05/07/22 120/72   02/11/22 128/82   05/09/21 110/76      Wt Readings from Last 3 Encounters:   05/07/22 87.4 kg (192 lb 9.6 oz)   03/24/22 85.3 kg (188 lb)   02/11/22 85.3 kg (188 lb)            Objective:   Physical Exam  Constitutional:       Appearance: Normal appearance. She is not toxic-appearing.   HENT:      Nose: Nose normal.      Mouth/Throat:      Pharynx: Oropharynx is clear.   Cardiovascular:      Rate and Rhythm: Normal rate and regular rhythm.      Heart sounds: Normal  heart sounds.   Pulmonary:      Effort: Pulmonary effort is normal.      Breath sounds: Normal breath sounds.   Musculoskeletal:      Cervical back: Neck supple.      Right lower leg: No edema.      Left lower leg: No edema.   Neurological:      Mental Status: She is alert and oriented to person, place, and time.      Deep Tendon Reflexes: Reflexes normal.   Psychiatric:         Mood and Affect: Mood normal.         Behavior: Behavior normal.         Thought Content: Thought content normal.         Judgment: Judgment normal.             Assessment:    Asthma  HTN  GERD  Hypothyroid  Overweight         Plan:    Cont current meds  Recent labs reviewed, WNL    [facility] Hypertension Plan    Based on this patient's clinical history and according to JNC guidelines target BP goal is: less than 140/90 (or 150/90 due to age [facility]))  Based on the patient's last BP of BP: 120/72 the patient is: at goal  The plan to reach goal   1.  Reviewed pt's understanding of medications including any barriers to adherence.   2.  Recommended lifestyle modifications: weight reduction, exercise plan, DASH eating plan, dietary sodium reduction, and medication compliance   3.  The patient understands their clinical goals and will undertake self-management recommendations including:weight reduction  exercise plan  DASH eating plan  dietary sodium reduction  medication compliance     4.  Medication Management: no changes made    5.  Referral to Care Management:: No   6.  Patient Ed/Self-management tools provided: No    Watch diet  Stay active    Refuses flu    Cont care with provider in [facility]  Will recheck here when she is back in July  Will call prn

## 2022-05-29 ENCOUNTER — Ambulatory Visit: Payer: Medicare (Managed Care) | Admitting: Family Medicine

## 2022-08-22 ENCOUNTER — Other Ambulatory Visit: Payer: Self-pay | Admitting: Pediatrics

## 2022-08-22 DIAGNOSIS — J309 Allergic rhinitis, unspecified: Secondary | ICD-10-CM

## 2022-08-23 ENCOUNTER — Other Ambulatory Visit: Payer: Self-pay | Admitting: Family Medicine

## 2022-08-23 DIAGNOSIS — E039 Hypothyroidism, unspecified: Secondary | ICD-10-CM

## 2022-09-24 ENCOUNTER — Other Ambulatory Visit: Payer: Self-pay | Admitting: Family Medicine

## 2022-09-24 DIAGNOSIS — I10 Essential (primary) hypertension: Secondary | ICD-10-CM

## 2022-10-24 ENCOUNTER — Other Ambulatory Visit: Payer: Self-pay | Admitting: Family Medicine

## 2022-10-24 DIAGNOSIS — J309 Allergic rhinitis, unspecified: Secondary | ICD-10-CM

## 2023-02-05 IMAGING — MG MAMMOGRAPHY SCREENING BILATERAL 3[PERSON_NAME]
8 series · 8 of 24 positions shown · non-contrast
Comparison: Comparison was made to prior examinations.

________________________________________________________________________________________________ 
MAMMOGRAPHY SCREENING BILATERAL 3LAUREEN HYPPOLITE, 02/05/2023 [DATE]: 
CLINICAL INDICATION: Encounter for screening mammogram.
TECHNIQUE: Digital bilateral mammograms and 3-D Tomosynthesis were obtained. 
These were interpreted both primarily and with the aid of computer-aided 
detection system.  
BREAST DENSITY: (Level B) There are scattered areas of fibroglandular density.

[R CC]
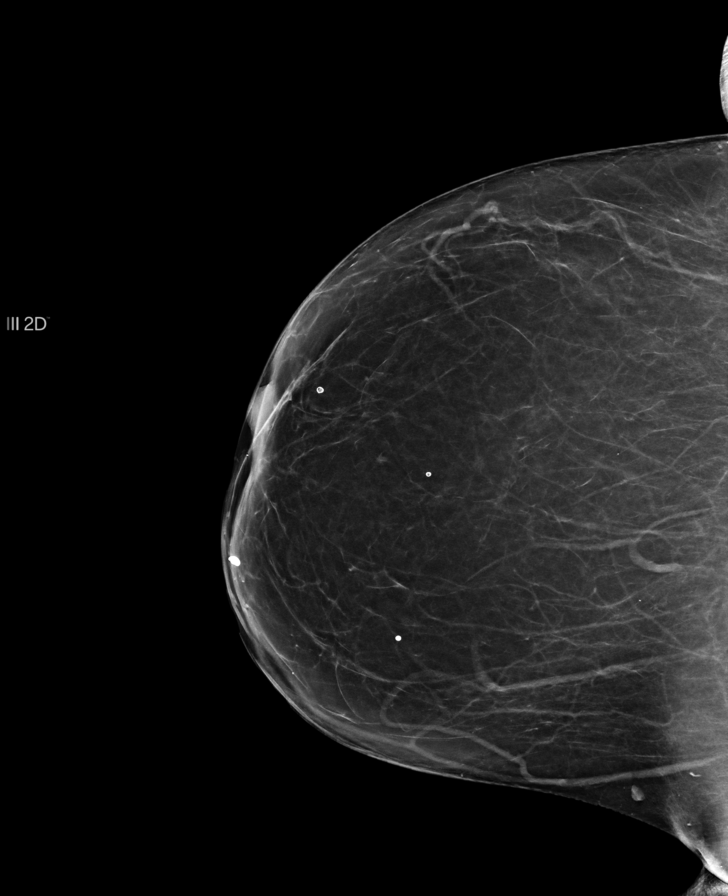

[L MLO]
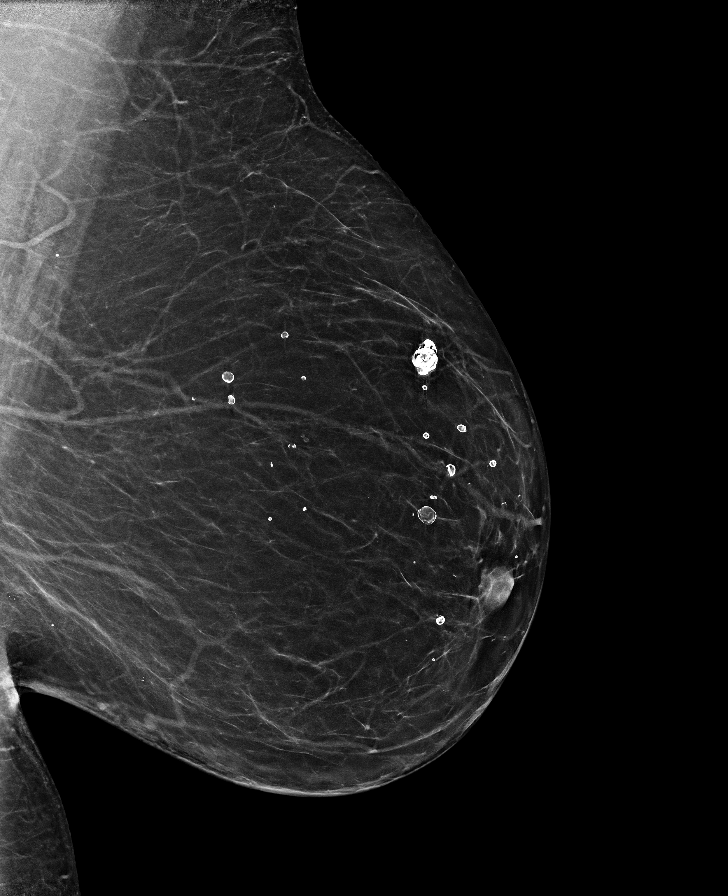

[L CC]
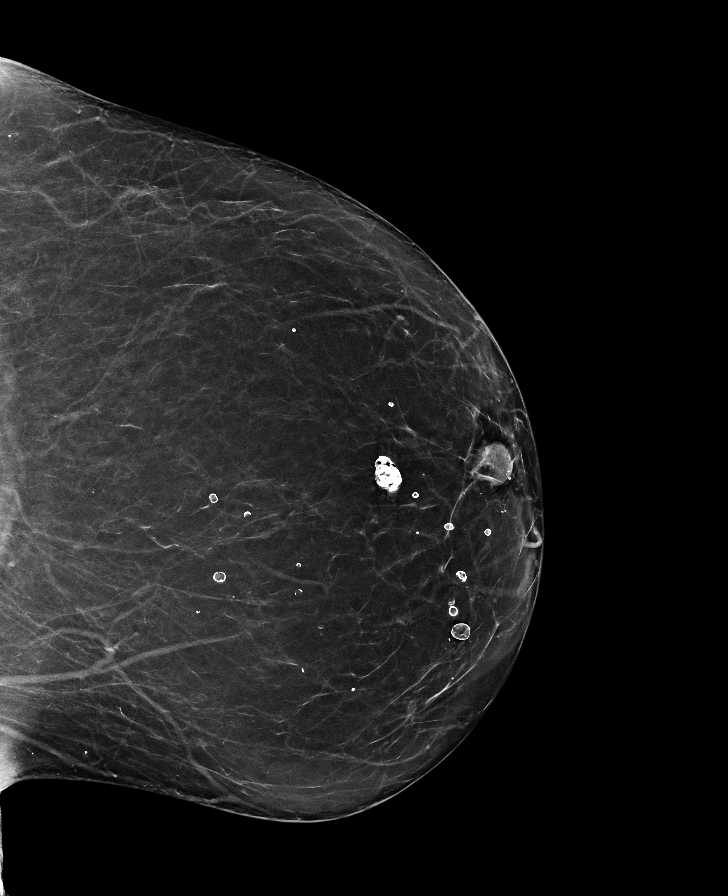

[R MLO]
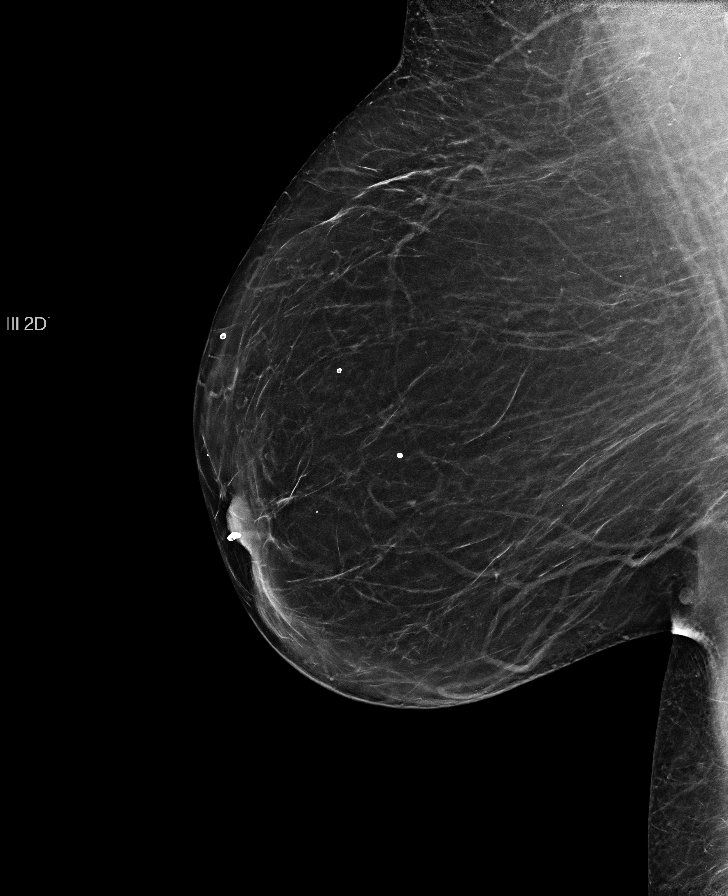

[L CC tomo · tomo slice 33/64.0]
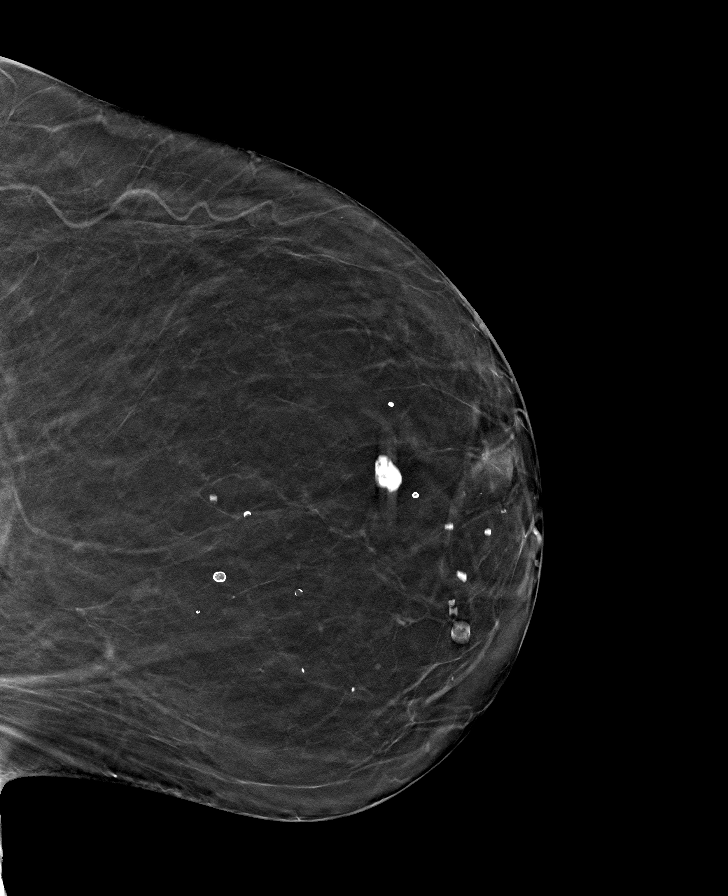

[L MLO tomo · tomo slice 35/68.0]
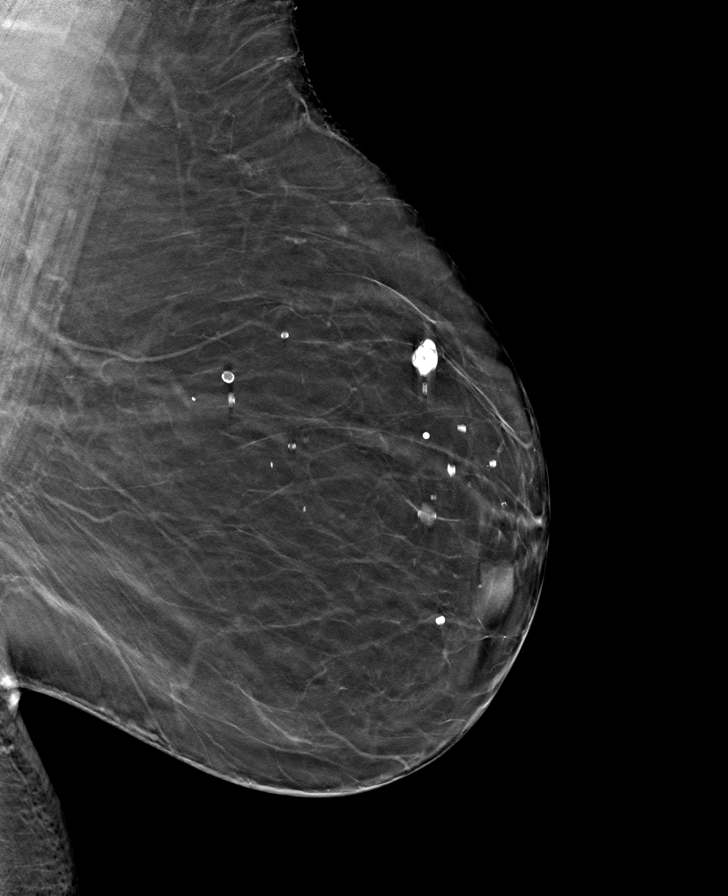

[R MLO tomo · tomo slice 33/66.0]
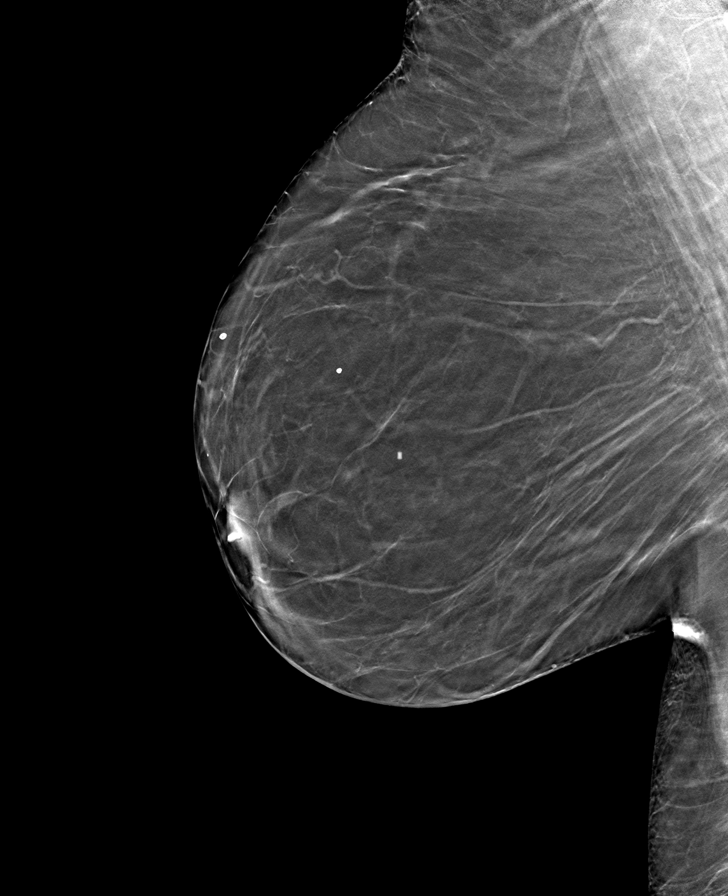

[R CC tomo · tomo slice 31/61.0]
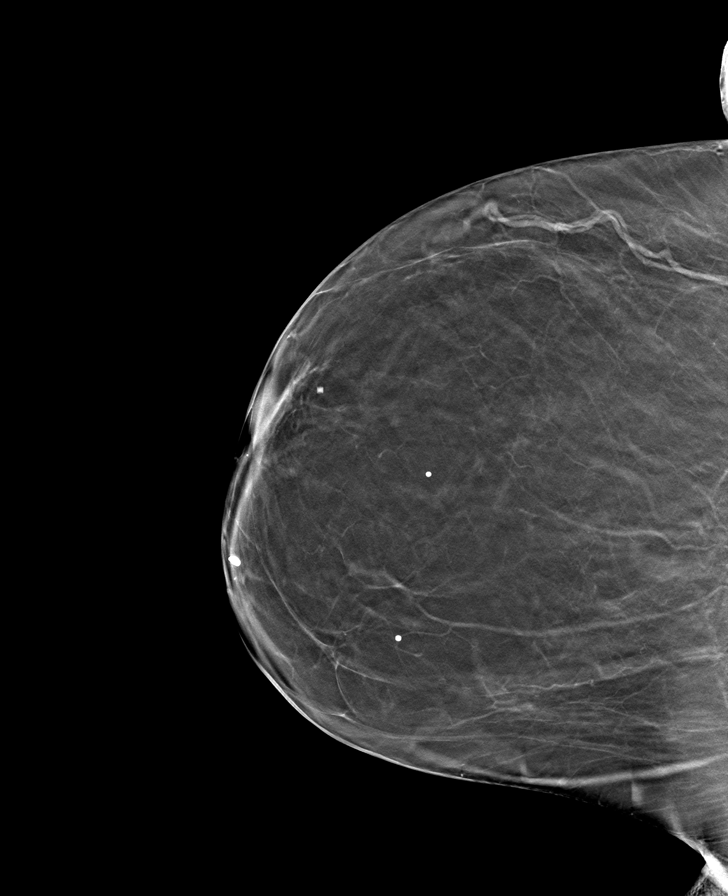

[8 of 24 positions shown; findings below may reference images not displayed]

FINDINGS: No suspicious mass, calcifications, or area of architectural 
distortion in either breast.
IMPRESSION: Stable mammogram. 
(BI-RADS 2) Benign findings. Routine mammographic follow-up is recommended.

## 2023-05-11 ENCOUNTER — Other Ambulatory Visit: Payer: Self-pay | Admitting: Family Medicine

## 2023-05-11 DIAGNOSIS — E039 Hypothyroidism, unspecified: Secondary | ICD-10-CM

## 2023-05-18 ENCOUNTER — Other Ambulatory Visit: Payer: Self-pay | Admitting: Family Medicine

## 2023-05-18 DIAGNOSIS — J309 Allergic rhinitis, unspecified: Secondary | ICD-10-CM

## 2023-06-16 ENCOUNTER — Other Ambulatory Visit: Payer: Self-pay | Admitting: Family Medicine

## 2023-06-17 ENCOUNTER — Other Ambulatory Visit: Payer: Self-pay | Admitting: Family Medicine

## 2023-06-19 IMAGING — MR MRI RIGHT FOOT WITHOUT CONTRAST
3 of 9 series · 6 of 40 positions shown · IV contrast (gadolinium)
Comparison: None

________________________________________________________________________________________________ 
MRI RIGHT FOOT WITHOUT CONTRAST, 06/19/2023 [DATE]: 
CLINICAL INDICATION: Chronic right foot pain
TECHNIQUE: Multiplanar, multiecho position MR images of the foot were performed 
without intravenous gadolinium enhancement. Patient was scanned on a 1.5T magnet

[Series 301: survey_right · axial · 10.0mm · 1.04mm/px · z∈[-96,+137]mm · 2 of 9 slices shown]
[im 1/9]
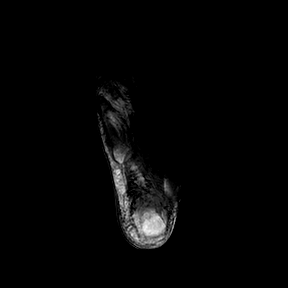
[im 9/9]
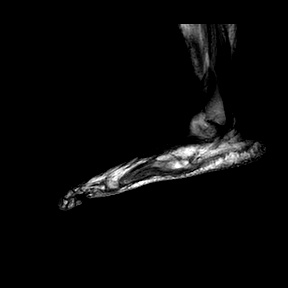

[Series 401: t1_sag_foot · sagittal · 3.0mm · 0.21mm/px · 3 of 29 slices shown]
[im 1/29]
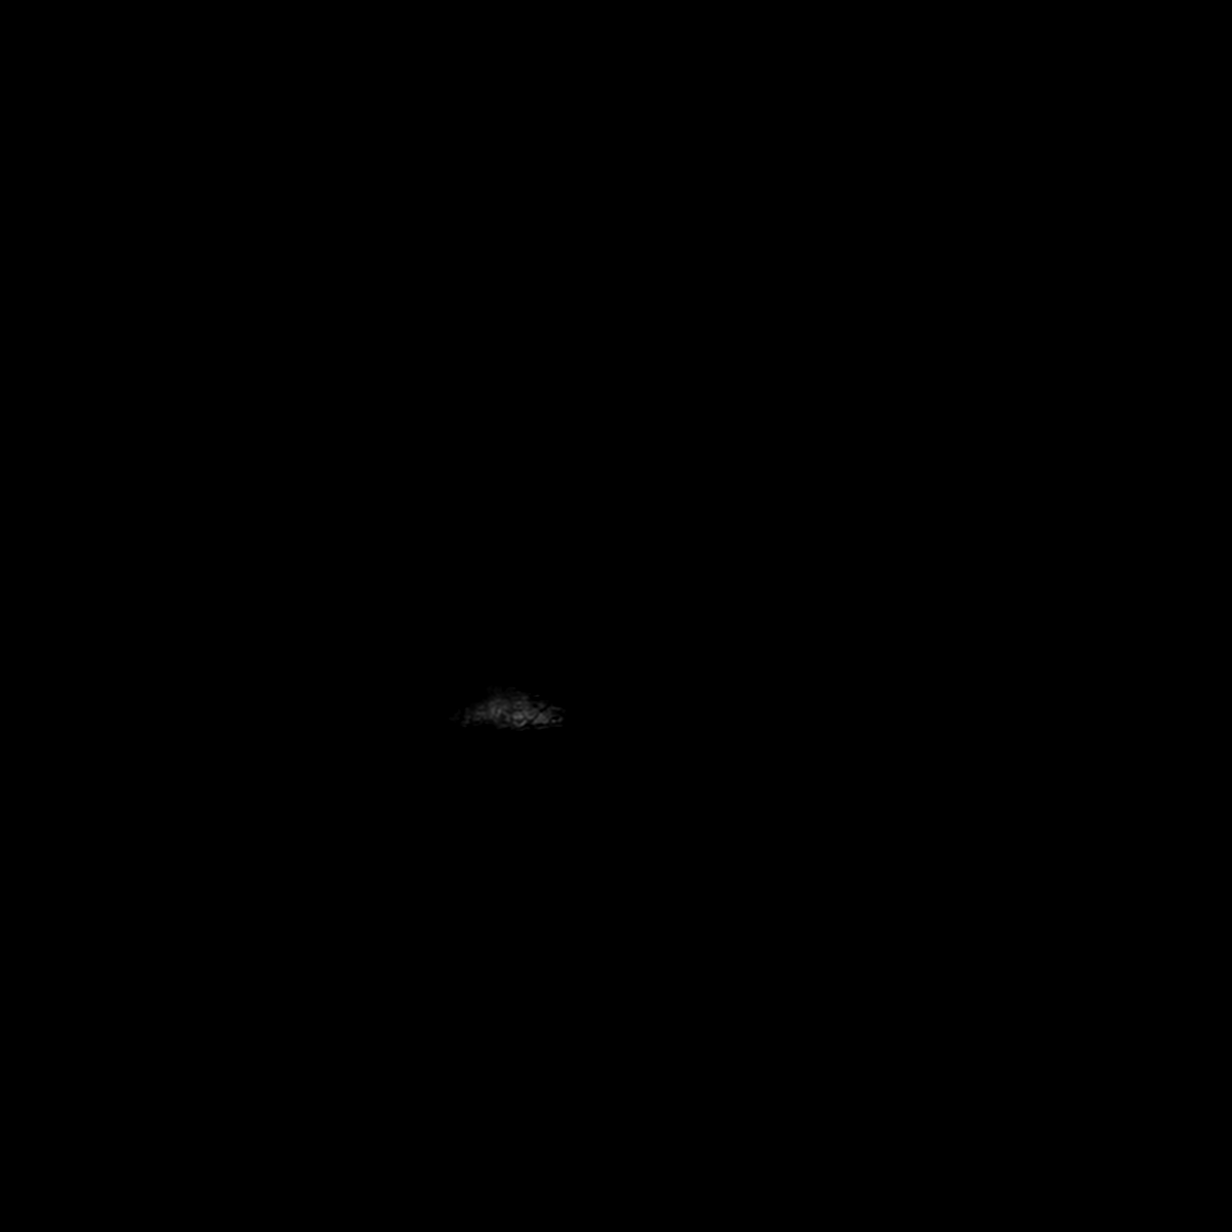
[im 19/29]
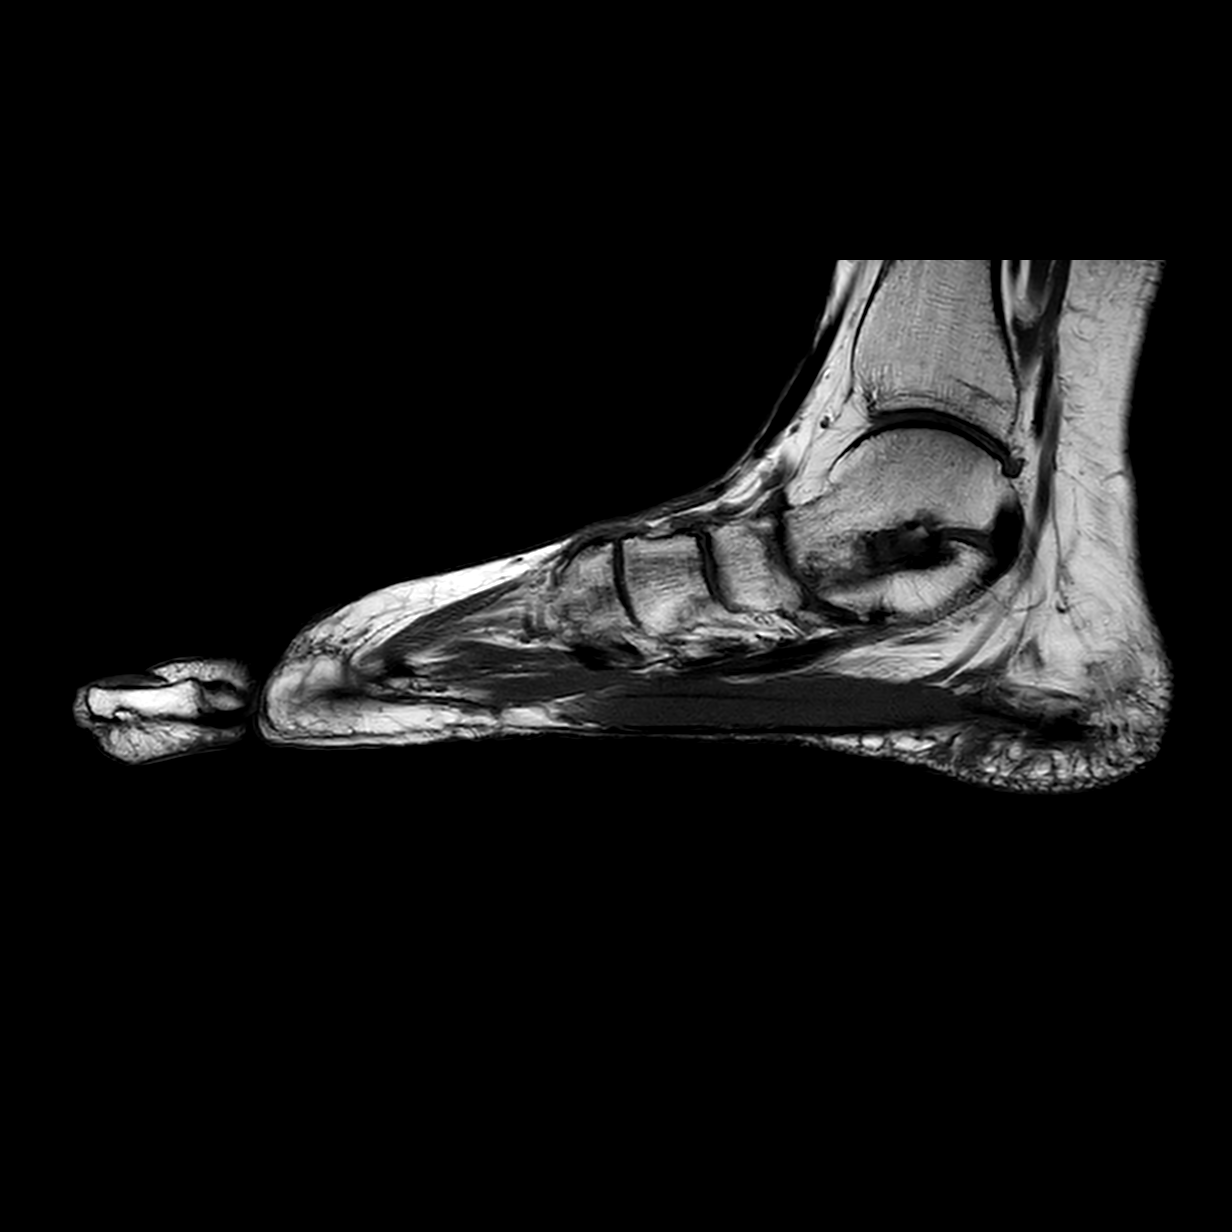
[im 29/29]
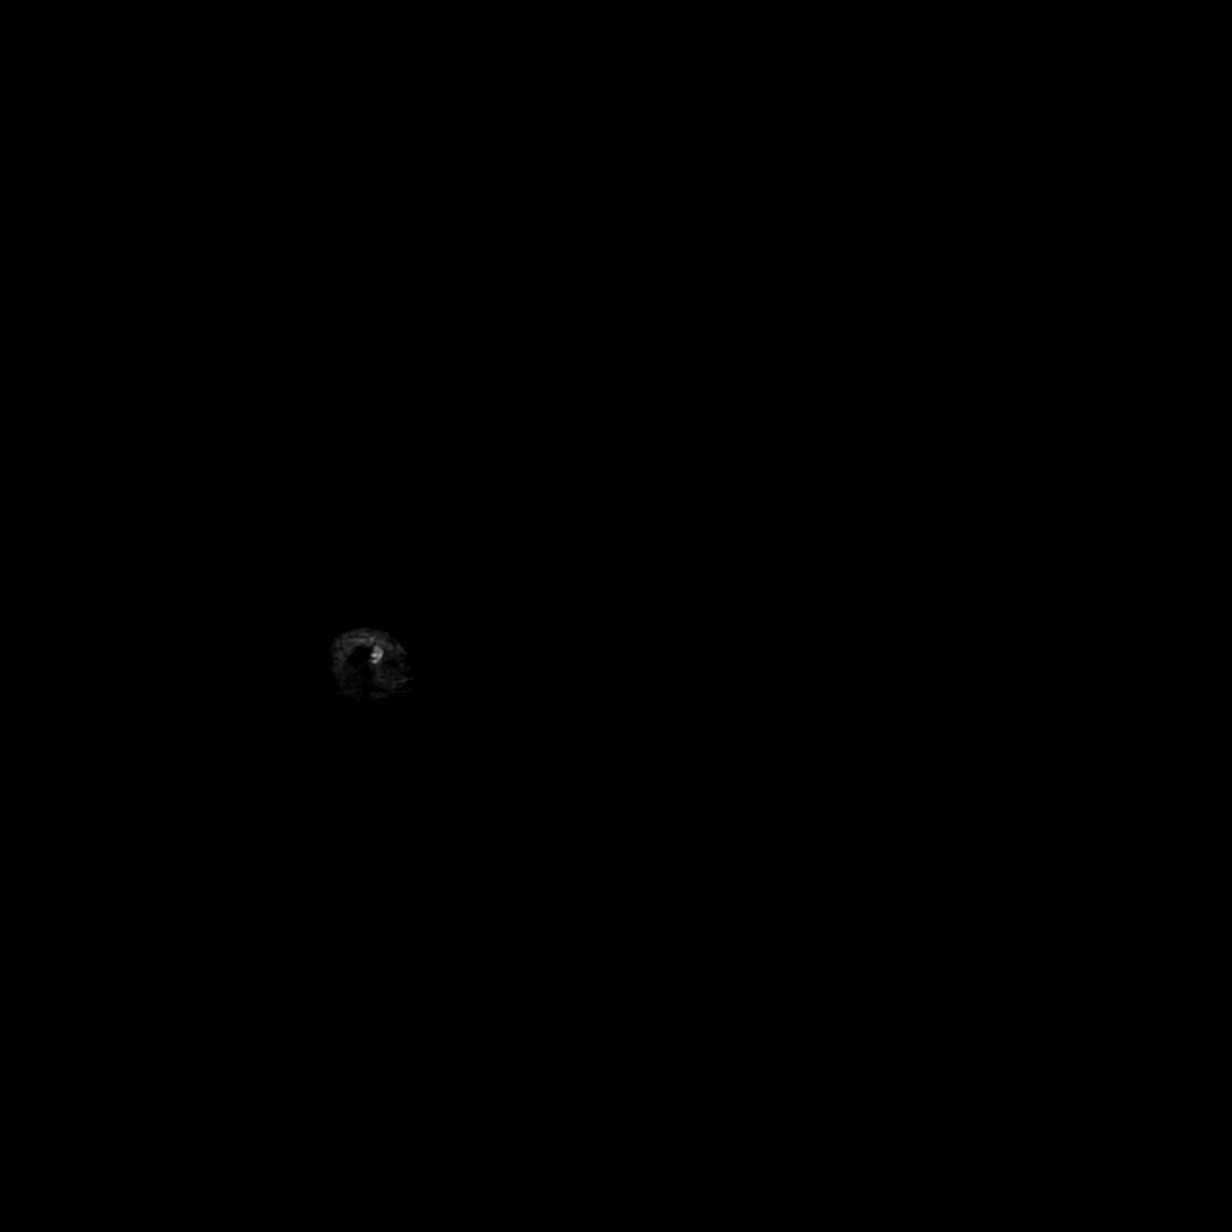

[Series 502: spd_fs_sag_ · sagittal · 3.0mm · 0.39mm/px · 1 of 26 slices shown]
[im 1/26]
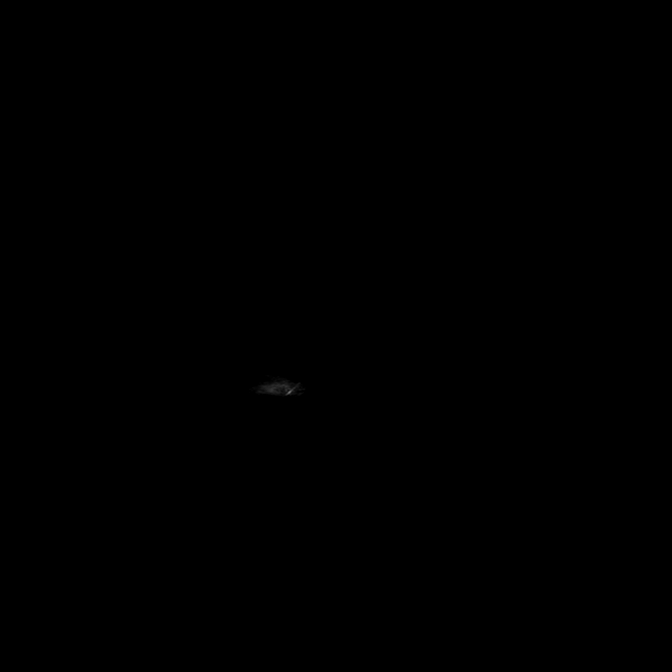

[6 of 40 positions shown; findings below may reference images not displayed]

FINDINGS: TENDONS: The flexor, extensor and peroneal tendons are intact. No tendon tear, 
tenosynovitis or tendinopathy. No tendon subluxation. Tiny focus of ossification 
within the Achilles tendon. No Achilles tendon tear. 
LIGAMENTS:  
LATERAL LIGAMENTS: The anterior talofibular ligament is intact. The 
calcaneofibular ligament and posterior talofibular ligament are preserved. 
SYNDESMOTIC LIGAMENTS: The anterior and posterior tibiofibular and interosseous 
ligaments are preserved. 
DELTOID LIGAMENTOUS COMPLEX: The deep and superficial components of the deltoid 
ligamentous complex are intact. 
SINUS TARSI LIGAMENTS: The cervical and interosseous ligaments are preserved. 
The inferior extensor retinaculum appears intact.  
BONES AND JOINTS: Normal bone marrow signal intensity. No fracture, contusion or 
stress response. The talar dome is preserved. No osteochondral lesion. Plantar 
calcaneal enthesophyte. Ankle is preserved. Degenerative change of the anterior 
subtalar joint with osteophyte extending into the sinus tarsi. Probable pes 
planovalgus. Mild degenerative change of the midfoot. Marked degenerative change 
of the hallux metatarsal-phalangeal (MTP) joint and hallux valgus deformity. 
Degenerative change of the laterally subluxed hallux sesamoids. The remaining 
MTP and interphalangeal (IP) joints are preserved. 
MUSCLES: Musculature is symmetric without mass, signal abnormality or atrophy.  
OTHER SOFT TISSUES: Dorsal skin marker overlying the second metatarsal head 
indicates the site of symptoms. 0.5 x 1.0 x 1.2 cm intermediate signal intensity 
soft tissue nodule in the second distal intermetatarsal space, consistent with 
interdigital neuroma. Small amount of fluid in the second intermetatarsal bursa, 
consistent with bursitis. Effacement of the sinus tarsi fat. Tarsal tunnel is 
preserved. Plantar fascia is intact. No ulceration. No abscess.
IMPRESSION: 1.  0.5 x 1.0 x 1.2 cm interdigital neuroma in the second distal intermetatarsal 
space and second intermetatarsal bursitis.  
2.  Marked degenerative change of the hallux MTP joint and hallux valgus 
deformity. Degenerative change of the laterally subluxed hallux sesamoids.  
3.  Mild degenerative change of the midfoot and probable pes planovalgus.  
4.  Plantar calcaneal enthesophyte and small focus of ossification within the 
distal Achilles tendon.

## 2023-08-14 ENCOUNTER — Other Ambulatory Visit: Payer: Self-pay | Admitting: Family Medicine

## 2023-08-14 IMAGING — CT CT [PERSON_NAME] RIGHT KNEE WO CONTRAST
1 of 3 series · 9 of 14 positions shown, 12 images · non-contrast
Comparison: None

________________________________________________________________________________________________ 
CT SHANELLE RIGHT KNEE WO CONTRAST, 08/14/2023 [DATE]: 
CLINICAL INDICATION: Unilateral primary osteoarthritis, right knee.
TECHNIQUE: The right lower extremity was scanned without contrast on a 
high-resolution CT scanner using dose reduction techniques and SHANELLE protocol. 
Routine MPR reconstructions were performed. Count of known CT and Cardiac 
Nuclear Medicine studies performed in the previous 12 months = 0.

[Series 3: knee · axial · 0.45mm/px · z∈[-669,-448]mm · 9 of 396 slices shown, 12 images]
[im 40/396  soft-tissue]
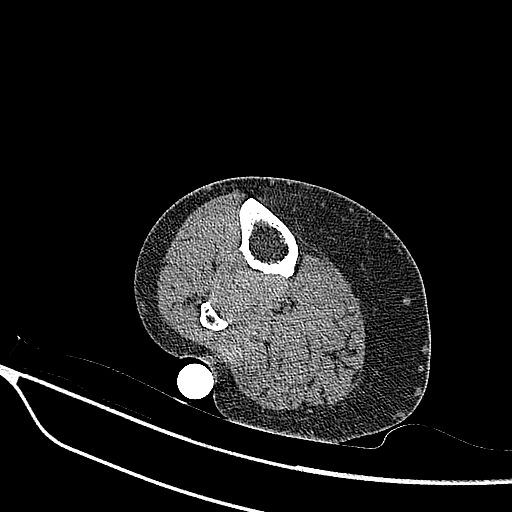
[im 40/396  bone]
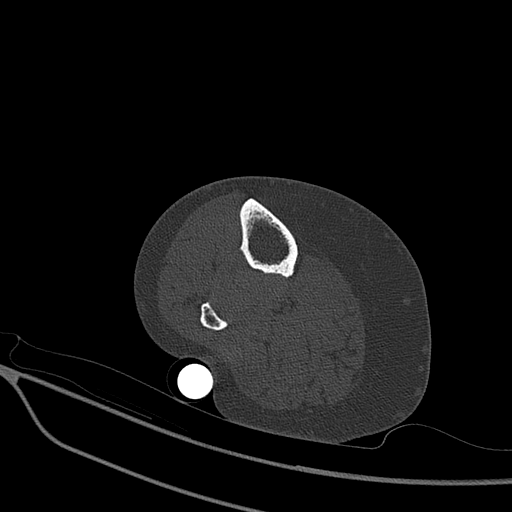
[im 80/396  bone]
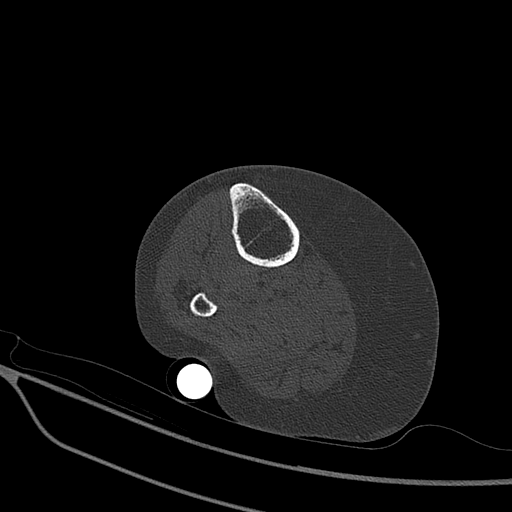
[im 119/396  bone]
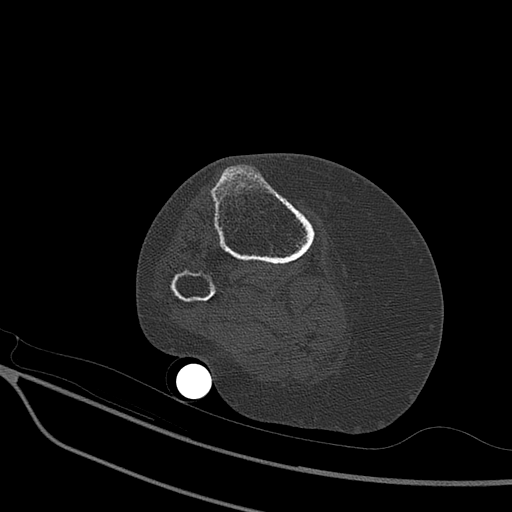
[im 159/396  bone]
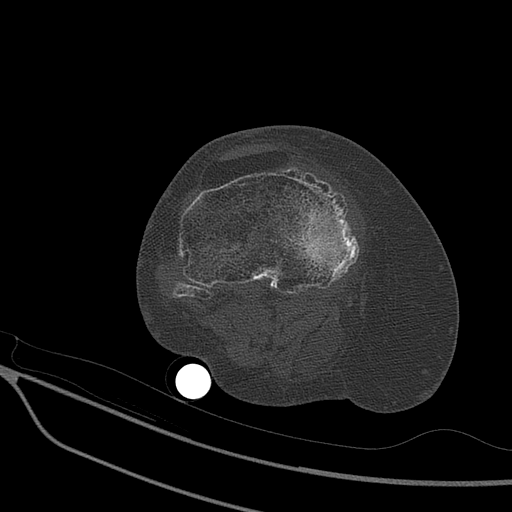
[im 198/396  soft-tissue]
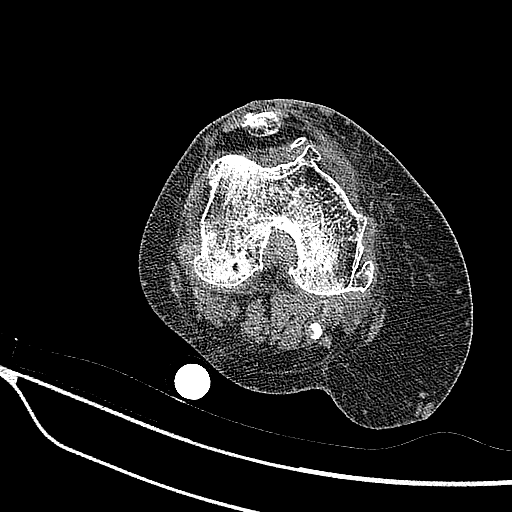
[im 198/396  bone]
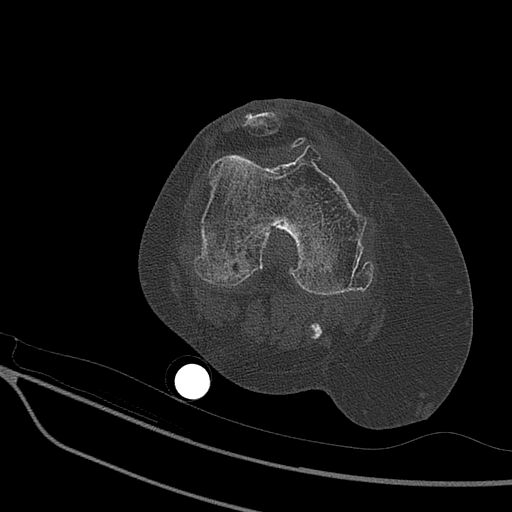
[im 238/396  bone]
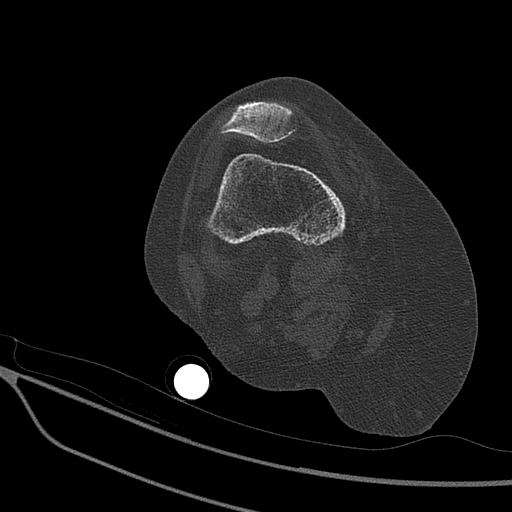
[im 277/396  bone]
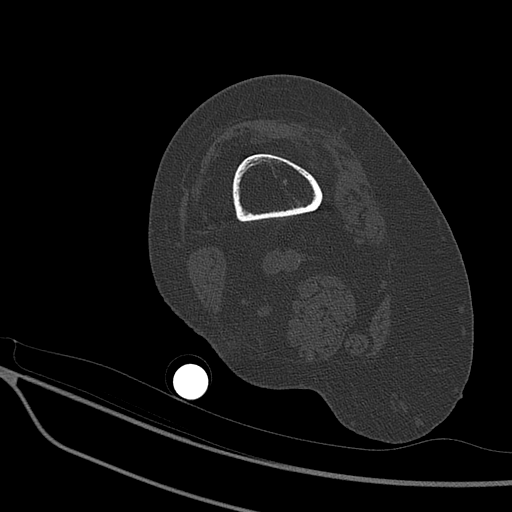
[im 317/396  bone]
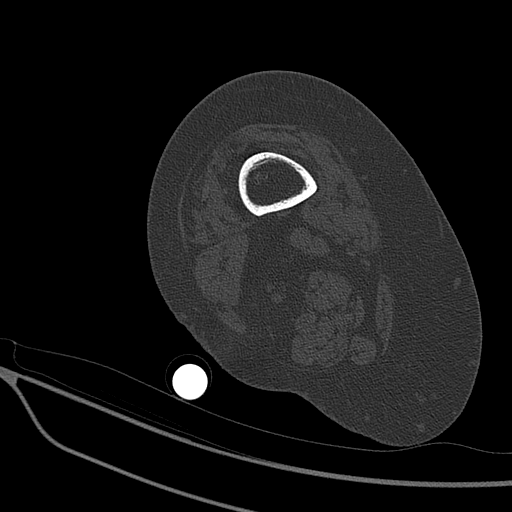
[im 356/396  soft-tissue]
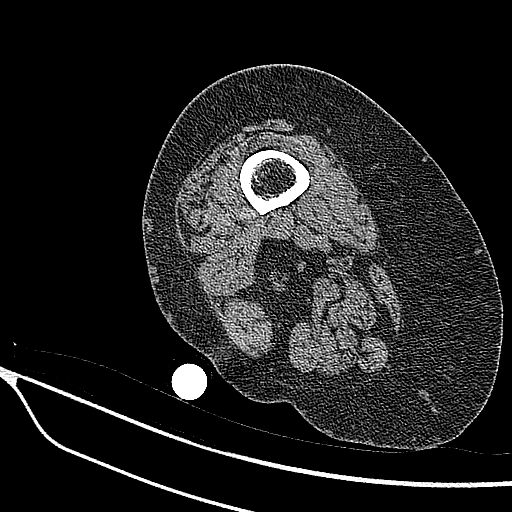
[im 356/396  bone]
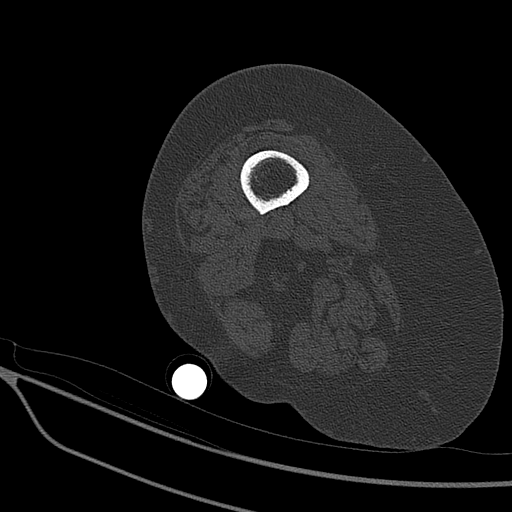

[9 of 14 positions shown; findings below may reference images not displayed]

FINDINGS: HIP: Preserved. 
KNEE: Degenerative change and small joint effusion. 
ANKLE/HINDFOOT: Mild degenerative change. 
OTHER: Osteopenia. No periarticular soft tissue mass or fluid collection.
IMPRESSION: Right lower extremity CT using SHANELLE protocol. 
RADIATION DOSE REDUCTION: All CT scans are performed using radiation dose 
reduction techniques, when applicable. Technical factors are evaluated and 
adjusted to ensure appropriate moderation of exposure. Automated dose management 
technology is applied to adjust the radiation doses to minimize exposure while 
achieving diagnostic quality images.

## 2023-08-20 ENCOUNTER — Telehealth: Payer: Self-pay | Admitting: Family Medicine

## 2023-08-20 NOTE — Telephone Encounter (Signed)
Patient calling in,    She is currently living in [place] but is still on dr [employer]. She states she wasn't able to come up last year due to family things going on but she is going to be in [place] in August and is due for a subs well.    Okay to schedule?

## 2023-08-24 NOTE — Telephone Encounter (Signed)
 This has been scheduled - patient aware

## 2023-10-06 ENCOUNTER — Other Ambulatory Visit: Payer: Self-pay | Admitting: Family Medicine

## 2023-10-06 DIAGNOSIS — I10 Essential (primary) hypertension: Secondary | ICD-10-CM

## 2023-11-20 ENCOUNTER — Other Ambulatory Visit: Payer: Self-pay | Admitting: Family Medicine

## 2023-11-20 DIAGNOSIS — J309 Allergic rhinitis, unspecified: Secondary | ICD-10-CM

## 2023-11-20 MED ORDER — FLUTICASONE PROPIONATE 50 MCG/ACT NA SUSP *I*
2.0000 | Freq: Every day | NASAL | 1 refills | Status: DC
Start: 2023-11-20 — End: 2024-01-13

## 2023-11-26 DIAGNOSIS — E039 Hypothyroidism, unspecified: Secondary | ICD-10-CM

## 2023-11-26 DIAGNOSIS — R739 Hyperglycemia, unspecified: Secondary | ICD-10-CM

## 2023-11-26 DIAGNOSIS — I10 Essential (primary) hypertension: Secondary | ICD-10-CM

## 2023-11-26 DIAGNOSIS — J45909 Unspecified asthma, uncomplicated: Secondary | ICD-10-CM

## 2023-11-30 NOTE — Telephone Encounter (Signed)
 Patient returned call and has been rescheduled.  She is asking to have labs done and she will check mychart for them.  [phone]

## 2023-12-01 NOTE — Telephone Encounter (Signed)
 Patient wants to keep appointment for [facility] in Sept. Just asking to have labs put in to get done before then.

## 2024-01-13 ENCOUNTER — Other Ambulatory Visit: Payer: Self-pay | Admitting: Family Medicine

## 2024-01-13 DIAGNOSIS — J309 Allergic rhinitis, unspecified: Secondary | ICD-10-CM

## 2024-01-13 MED ORDER — FLUTICASONE PROPIONATE 50 MCG/ACT NA SUSP *I*
2.0000 | Freq: Every day | NASAL | 1 refills | Status: AC
Start: 2024-01-13 — End: ?

## 2024-01-21 ENCOUNTER — Other Ambulatory Visit: Payer: Self-pay | Admitting: Family Medicine

## 2024-01-21 DIAGNOSIS — E039 Hypothyroidism, unspecified: Secondary | ICD-10-CM

## 2024-02-19 ENCOUNTER — Other Ambulatory Visit: Payer: Self-pay | Admitting: Family Medicine

## 2024-02-24 ENCOUNTER — Ambulatory Visit: Payer: Medicare (Managed Care) | Admitting: Family Medicine

## 2024-02-26 ENCOUNTER — Other Ambulatory Visit
Admission: RE | Admit: 2024-02-26 | Discharge: 2024-02-26 | Disposition: A | Discharge status: Home / Self Care | Payer: Medicare (Managed Care) | Source: Ambulatory Visit | Attending: Family Medicine | Admitting: Family Medicine

## 2024-02-26 DIAGNOSIS — R739 Hyperglycemia, unspecified: Secondary | ICD-10-CM | POA: Insufficient documentation

## 2024-02-26 DIAGNOSIS — J45909 Unspecified asthma, uncomplicated: Secondary | ICD-10-CM | POA: Insufficient documentation

## 2024-02-26 DIAGNOSIS — I10 Essential (primary) hypertension: Secondary | ICD-10-CM | POA: Insufficient documentation

## 2024-02-26 DIAGNOSIS — E039 Hypothyroidism, unspecified: Secondary | ICD-10-CM | POA: Insufficient documentation

## 2024-02-26 LAB — CBC
Hematocrit: 43 % (ref 34–49)
Hemoglobin: 14 g/dL (ref 11.2–16.0)
MCV: 94 fL (ref 75–100)
Platelets: 297 THOU/uL (ref 150–450)
RBC: 4.5 MIL/uL (ref 4.0–5.5)
RDW: 12.1 % (ref 0.0–15.0)
WBC: 6.3 THOU/uL (ref 3.5–11.0)

## 2024-02-26 LAB — COMPREHENSIVE METABOLIC PANEL
ALT: 11 U/L (ref 0–35)
AST: 19 U/L (ref 0–35)
Albumin: 4 g/dL (ref 3.5–5.2)
Alk Phos: 68 U/L (ref 35–105)
Anion Gap: 14 (ref 7–16)
Bilirubin,Total: 0.3 mg/dL (ref 0.0–1.2)
CO2: 23 mmol/L (ref 20–28)
Calcium: 9.3 mg/dL (ref 8.6–10.2)
Chloride: 102 mmol/L (ref 96–108)
Creatinine: 0.74 mg/dL (ref 0.51–0.95)
Glucose: 92 mg/dL (ref 60–99)
Lab: 14 mg/dL (ref 6–20)
Potassium: 4 mmol/L (ref 3.3–4.6)
Sodium: 139 mmol/L (ref 133–145)
Total Protein: 6.8 g/dL (ref 6.3–7.7)
eGFR BY CREAT: 88

## 2024-02-26 LAB — LIPID PANEL
Chol/HDL Ratio: 3.8
Cholesterol: 195 mg/dL
HDL: 51 mg/dL (ref 40–60)
LDL Calculated: 124 mg/dL
Non HDL Cholesterol: 144 mg/dL
Triglycerides: 110 mg/dL

## 2024-02-26 LAB — MAGNESIUM: Magnesium: 2.1 mg/dL (ref 1.6–2.5)

## 2024-02-26 LAB — T4, FREE: Free T4: 1.4 ng/dL (ref 0.9–1.7)

## 2024-02-26 LAB — TSH: TSH: 0.29 u[IU]/mL (ref 0.27–4.20)

## 2024-02-28 LAB — HEMOGLOBIN A1C: Hemoglobin A1C: 5.6 % (ref <=5.6)

## 2024-03-08 ENCOUNTER — Ambulatory Visit: Payer: Medicare (Managed Care) | Attending: Family Medicine | Admitting: Family Medicine

## 2024-03-08 ENCOUNTER — Encounter: Payer: Self-pay | Admitting: Family Medicine

## 2024-03-08 ENCOUNTER — Other Ambulatory Visit: Payer: Self-pay

## 2024-03-08 VITALS — BP 130/76 | HR 64 | Temp 98.2°F | Ht 62.0 in | Wt 183.0 lb

## 2024-03-08 DIAGNOSIS — I10 Essential (primary) hypertension: Secondary | ICD-10-CM | POA: Insufficient documentation

## 2024-03-08 DIAGNOSIS — J4520 Mild intermittent asthma, uncomplicated: Secondary | ICD-10-CM | POA: Insufficient documentation

## 2024-03-08 DIAGNOSIS — E039 Hypothyroidism, unspecified: Secondary | ICD-10-CM | POA: Insufficient documentation

## 2024-03-08 DIAGNOSIS — K219 Gastro-esophageal reflux disease without esophagitis: Secondary | ICD-10-CM | POA: Insufficient documentation

## 2024-03-08 DIAGNOSIS — R739 Hyperglycemia, unspecified: Secondary | ICD-10-CM | POA: Insufficient documentation

## 2024-03-08 NOTE — Progress Notes (Signed)
 Patient ID: Patty Robertson is a 68 y.o. year old female.Chief Complaint Patient presents with  Follow-up   FUV HPI:68 year old white female who has not been seen in the office in over 2 years.She has been staying in [place]  for much of the year.  She does wish to continue coming up to this area in the summertime so she does want to have a physician she can call if she is sick.She does have multiple physicians in [place]  including her primary care.Did have R TKR about 6 months ago. Wound up with nausea that started about 2 weeks after surgery and lasted over a week.  Eventually the symptoms just resolved.  She had been on multiple different medications at that time due to the surgery.She is very happy with the outcome from the surgery.  Has no pain and is able to walk normally.Didn't come up to this area at all in 2024. Stayed in [place]  taking care of her father-in-law.  Evidently he was sick enough that he really could not travel and they could not leave them alone.Did have Covid 05/2023.  They did not get particularly sick.Overall she feels she is doing good.  Has not had any chest pain, palpitations or shortness of breath.  Her blood pressure is good when she does check.She is due for mammogram which she will get done in [place] .She did have a colonoscopy done in [place]  this past year.  She did have 1 very small polyp but nothing to worry about.  She we will go back again in 5 years.She has not had any recent illnesses.  Her asthma has been well-controlled.She does feel her energy level is good.  She continues to stay very active.Is happy with her weight loss.  Does try to watch what she eats and is eating in smaller amounts.She was hoping to get her wellness exam done here but her primary care in [place]  has to do this per insurance.The medication and allergy list was reviewed and is accurateAllergy / Social History /  Medications:Allergies[1]Social History Tobacco Use  Smoking status: Never   Passive exposure: Never  Smokeless tobacco: Never Substance Use Topics  Alcohol use: Yes   Alcohol/week: 1.0 standard drink of alcohol   Types: 1 Glasses of wine per week   Comment: rare Patient's Medications New Prescriptions  No medications on file Previous Medications  ALBUTEROL  HFA (PROVENTIL , VENTOLIN , PROAIR  HFA) 108 (90 BASE) MCG/ACT INHALER    Inhale 2 puffs into the lungs every 4 hours as needed for Wheezing Shake well before each use.  CALCIUM CITRATE-VITAMIN D (CITRACAL+D) 315-250 MG-UNIT PER TABLET    Take 2 tablets by mouth daily.  DILTIAZEM  (CARDIZEM  CD, CARTIA  XT, DILT-CD ) 240 MG 24 HR CAPSULE    TAKE 1 CAPSULE BY MOUTH DAILY .  SWALLOW WHOLE .  DO NOT CRUSH OR CHEW  FAMOTIDINE (PEPCID) 20 MG TABLET    Take 1 tablet (20 mg total) by mouth 2 times daily.  FLUTICASONE  (FLONASE ) 50 MCG/ACT NASAL SPRAY    Spray 2 sprays into each nostril daily.  LOSARTAN  (COZAAR ) 50 MG TABLET    TAKE 1 TABLET BY MOUTH DAILY  MAGNESIUM 250 MG TABS    Take 1 tablet (250 mg total) by mouth daily.  POTASSIUM CHLORIDE  SA (KLOR-CON  M20) 20 MEQ  TABLET    TAKE 1 TABLET BY MOUTH TWICE  DAILY  PREDNISOLONE ACETATE (PRED FORTE) 1 % OPHTHALMIC SUSPENSION    SMARTSIG:In Eye(s)  PSYLLIUM (METAMUCIL PO)    Take by  mouth  SYNTHROID  112 MCG TABLET    TAKE 1 TABLET BY MOUTH DAILY  TRIAMCINOLONE  (KENALOG ) 0.025 % LOTION    Apply topically 2 times daily as needed for Rash  TRIAMTERENE -HYDROCHLOROTHIAZIDE (DYAZIDE) 37.5-25 MG PER CAPSULE    TAKE 1 CAPSULE BY MOUTH DAILY Modified Medications  No medications on file Discontinued Medications  No medications on file  Physical Exam:Vitals:  03/08/24 1142 BP: 138/84 Pulse:  Temp:  Weight:  Height:  @LASTSAO2 (3)@Estimated  body mass index is 33.47 kg/m as calculated from the following:  Height as of this encounter: 1.575 m (5'  2).  Weight as of this encounter: 83 kg (183 lb).BP Readings from Last 3 Encounters: 03/08/24 138/84 05/07/22 120/72 02/11/22 128/82 Wt Readings from Last 3 Encounters: 03/08/24 83 kg (183 lb) 05/07/22 87.4 kg (192 lb 9.6 oz) 03/24/22 85.3 kg (188 lb) General: Well-developed well-nourished white female in no acute distress.  Repeat BP 130/76.  Weight is down 9 pounds from her last visit here.HEENT: Normocephalic; conjunctivae pink with some mild injection of R. Fundi grossly normal.  Nose mild congestion.  TMs are normal; pharynx no erythema.Neck: supple, no adenopathy, thyroid  is symmetrically enlarged without nodules.Lungs: Clear with decreased breath sounds in both bases.Heart: Regular rate with 1-2/6 systolic murmur.  No S3.  Carotids 2+ without bruits.  No JVD. Abdomen: Soft, nontender, no hepatosplenomegaly or other masses.  Active bowel sounds.Back: Normal curvature, nontender.  Straight leg raising negative.Extremities: Full range of motion, osteoarthritic changes of both knees, right greater than left.  Trace peripheral edema. Neurologic: Deep tendon reflexes are +2 and symmetrical throughout.  Cranial nerves II through XII are grossly intact.  Strength intact throughout.  Normal sensation in hands and feet.  Recent Lab Results:noneAssessment and Plan: HypertensionHypothyroidOsteoarthritis kneesOverweightAsthma/mild intermittentHeart murmur/stableOrders this visitNo orders of the defined types were placed in this encounter.Advised her that she looks good at this time.  She will continue follow-up in [place]  with her specialists and primary care.Is to make sure she gets her mammogram done.  Will do the wellness exam.Her colon cancer screening is good until 2030 now.She did already get the COVID-vaccine.Advised her that her blood pressure is good on repeat.  She is to continue checking this at home.Continue to work on  [employer] and exercise.Support given for some family stress and issues.Will plan to see her here about once a year for follow-up.  She can call for sick visits whenever she is up here. [1] AllergiesAllergen Reactions  Sulfa Antibiotics Nausea And Vomiting  Celebrex [Celecoxib] Hives  Bextra [Valdecoxib] Other (See Comments)   unknown  Biaxin [Clarithromycin] Other (See Comments)   Reaction not specified  Erythromycin Ethylsuccinate [Erythromycin] Other (See Comments)   Reaction not specified  Polysporin [Bacitracin-Polymyxin B] Other (See Comments)   unknown

## 2025-03-08 ENCOUNTER — Encounter: Payer: Medicare (Managed Care) | Admitting: Family Medicine

## 8323-03-31 DEATH — deceased
